# Patient Record
Sex: Female | Born: 1945 | Race: Asian | Hispanic: No | Marital: Married | Smoking: Never smoker
Health system: Southern US, Community
[De-identification: ages and names within clinical notes are randomized; demographics above are authoritative.]

## PROBLEM LIST (undated history)

## (undated) DIAGNOSIS — I10 Essential (primary) hypertension: Secondary | ICD-10-CM

## (undated) DIAGNOSIS — E785 Hyperlipidemia, unspecified: Secondary | ICD-10-CM

## (undated) DIAGNOSIS — M1990 Unspecified osteoarthritis, unspecified site: Secondary | ICD-10-CM

## (undated) HISTORY — DX: Essential (primary) hypertension: I10

## (undated) HISTORY — PX: CATARACT EXTRACTION: SUR2

## (undated) HISTORY — DX: Hyperlipidemia, unspecified: E78.5

## (undated) HISTORY — DX: Unspecified osteoarthritis, unspecified site: M19.90

---

## 2003-08-06 ENCOUNTER — Ambulatory Visit (HOSPITAL_COMMUNITY): Admission: RE | Admit: 2003-08-06 | Discharge: 2003-08-06 | Payer: Self-pay | Admitting: Internal Medicine

## 2003-08-06 ENCOUNTER — Encounter: Payer: Self-pay | Admitting: Internal Medicine

## 2005-06-27 ENCOUNTER — Ambulatory Visit: Payer: Self-pay | Admitting: Internal Medicine

## 2007-10-23 HISTORY — PX: MINOR HEMORRHOIDECTOMY: SHX6238

## 2010-07-31 ENCOUNTER — Encounter: Payer: Self-pay | Admitting: Family Medicine

## 2010-07-31 ENCOUNTER — Ambulatory Visit: Payer: Self-pay | Admitting: Family Medicine

## 2010-07-31 DIAGNOSIS — E785 Hyperlipidemia, unspecified: Secondary | ICD-10-CM | POA: Insufficient documentation

## 2010-07-31 DIAGNOSIS — I10 Essential (primary) hypertension: Secondary | ICD-10-CM | POA: Insufficient documentation

## 2010-07-31 DIAGNOSIS — J309 Allergic rhinitis, unspecified: Secondary | ICD-10-CM | POA: Insufficient documentation

## 2010-07-31 LAB — CONVERTED CEMR LAB
ALT: 13 units/L (ref 0–35)
AST: 14 units/L (ref 0–37)
Albumin: 4.1 g/dL (ref 3.5–5.2)
Alkaline Phosphatase: 103 units/L (ref 39–117)
BUN: 12 mg/dL (ref 6–23)
CO2: 29 meq/L (ref 19–32)
Calcium: 9.3 mg/dL (ref 8.4–10.5)
Chloride: 103 meq/L (ref 96–112)
Creatinine, Ser: 0.6 mg/dL (ref 0.40–1.20)
Glucose, Bld: 123 mg/dL — ABNORMAL HIGH (ref 70–99)
HCT: 37.7 % (ref 36.0–46.0)
Hemoglobin: 12.1 g/dL (ref 12.0–15.0)
MCHC: 32.1 g/dL (ref 30.0–36.0)
MCV: 86.7 fL (ref 78.0–100.0)
Platelets: 335 10*3/uL (ref 150–400)
Potassium: 4 meq/L (ref 3.5–5.3)
RBC: 4.35 M/uL (ref 3.87–5.11)
RDW: 13 % (ref 11.5–15.5)
Sodium: 144 meq/L (ref 135–145)
Total Bilirubin: 0.8 mg/dL (ref 0.3–1.2)
Total Protein: 7.1 g/dL (ref 6.0–8.3)
WBC: 9.5 10*3/uL (ref 4.0–10.5)

## 2010-11-16 ENCOUNTER — Encounter (INDEPENDENT_AMBULATORY_CARE_PROVIDER_SITE_OTHER): Payer: Self-pay | Admitting: *Deleted

## 2010-11-21 NOTE — Assessment & Plan Note (Signed)
Summary: np,tcb   Vital Signs:  Patient profile:   65 year old Hart Weight:      168 pounds Temp:     99.2 degrees F oral Pulse rate:   73 / minute BP sitting:   121 / 76  (left arm)  Vitals Entered By: Jimmy Footman, CMA (July 31, 2010 2:32 PM) CC: NP Is Patient Diabetic? No   Primary Care Provider:  Alvia Grove  CC:  NP.  History of Present Illness: 65 yo Hart here to establish care.  Last seen by [facility] about 1 year ago. Pt does not have any records with her and she is very unsure of her medical history.  Did bring MOST medicatioins with her today, but admits to forgetting 1 or 2 bottles.   1. Hypertension: On atenolol, furosemide, asprin, fish oil, and K-Cl.  Stopped amlodipine a few weeks ago because she felt it made her legs swell.  Denies any chest pain, no SOB, no DOE. No history of MI's, no hospitalizations.  States dx was made at last OV at [facility].  Takes meds daily except for amlodipine.  No vision changes.  2. HLD: Taking statin daily, tolerates well.  No muscle aches or pains.  Has been taking for > 1 year.  Also takes OTC fish oil. 3. Allergies: Endorses runny nose, itchy eyes, itchy, dry ears.  Started about 5-6 years ago and feels symptoms worsen yearly.  Worse in the fall. Never taken any meds for this.   Habits & Providers  Alcohol-Tobacco-Diet     Alcohol drinks/day: 0     Tobacco Status: never  Exercise-Depression-Behavior     Drug Use: no  Current Problems (verified): 1)  Hypertension  (ICD-401.9) 2)  Family History of Anemia  (ICD-V18.2) 3)  Allergic Rhinitis  (ICD-477.9) 4)  Encounter For Long-term Use of Other Medications  (ICD-V58.69) 5)  Hyperlipidemia  (ICD-272.4) 6)  Essential Hypertension, Benign  (ICD-401.1)  Current Medications (verified): 1)  Atenolol 25 Mg Tabs (Atenolol) .... Take 1 Pill By Mouth At Bedtime 2)  Lotensin 20 Mg Tabs (Benazepril Hcl) .... Take 1 Pill By Mouth 3)  Aspirin 81 Mg Tbec (Aspirin) .Marland Kitchen.. 1 Pill By  Mouth Daily 4)  Pravastatin Sodium 40 Mg Tabs (Pravastatin Sodium) .Marland Kitchen.. 1 Pill By Mouth Daily 5)  Furosemide 20 Mg Tabs (Furosemide) .... Take 1 Pill By Mouth Daily 6)  K-Tabs 10 Meq Cr-Tabs (Potassium Chloride) .Marland Kitchen.. 1 Pill By Mouth 3 Times A Week 7)  Claritin 10 Mg Tabs (Loratadine) .... Take 1 Pill By Mouth Two Times A Day 8)  Antipyrine-Benzocaine 5.4-1.4 % Soln (Benzocaine-Antipyrine) .... 2-4 Gtt in Ears Qid As Needed 9)  Fish Oil 1000 Mg Caps (Omega-3 Fatty Acids) .Marland Kitchen.. 1 By Mouth Daily  Allergies (verified): No Known Drug Allergies  Past History:  Family History: Last updated: 07/31/2010 Unknown ? anemia in sibling Father dies at 24 y/o - unknown cause Mother died at 78 y/o - unknown cause  Social History: Last updated: 07/31/2010 Lives in [place] with her husband.  Born in [place], has lived in [place] for 16 years.  Has 3 children and multiple grandchildren. 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 Unknown ? anemia in sibling Father dies at 39 y/o - unknown cause Mother died at 64 y/o -  unknown cause  Social History: Reviewed history and no changes required. Lives in [place] with her husband.  Born in [place], has lived in [place] for 16 years.  Has 3 children and multiple grandchildren. 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 Never Smoked Alcohol use-no Drug use-no Smoking Status:  never Drug Use:  no  Review of Systems  The patient denies anorexia, fever, weight loss, weight gain, vision loss, decreased hearing, hoarseness, chest pain, syncope, dyspnea on exertion, peripheral edema, prolonged cough, headaches, hemoptysis, abdominal pain, melena, hematochezia, severe indigestion/heartburn, hematuria, incontinence,  genital sores, muscle weakness, suspicious skin lesions, transient blindness, difficulty walking, depression, unusual weight change, abnormal bleeding, enlarged lymph nodes, angioedema, breast masses, and testicular masses.    Physical Exam  General:  Vs reviewed, alert, well-developed, well-nourished, and well-hydrated.   Head:  normocephalic and atraumatic.   Eyes:  vision grossly intact.   Ears:  no external deformities.  bilateral ears have dry wax  Nose:  mucosal edema.   Mouth:  postnasal drip.   Neck:  No deformities, masses, or tenderness noted. Lungs:  Normal respiratory effort, chest expands symmetrically. Lungs are clear to auscultation, no crackles or wheezes. Heart:  Normal rate and regular rhythm. S1 and S2 normal without gallop, murmur, click, rub or other extra sounds. Abdomen:  soft, non-tender, and normal bowel sounds.   Msk:  normal ROM and no joint tenderness.   Extremities:  No clubbing, cyanosis, edema, or deformity noted with normal full range of motion of all joints.   Neurologic:  alert & oriented X3, cranial nerves II-XII intact, and DTRs symmetrical and normal.   Skin:  turgor normal.   Psych:  memory intact for recent and remote.     Impression & Recommendations:  Problem # 1:  ESSENTIAL HYPERTENSION, BENIGN (ICD-401.1) Continue current medications today.  Await records from [facility].   Her updated medication list for this problem includes:    Atenolol 25 Mg Tabs (Atenolol) .Marland Kitchen... Take 1 pill by mouth at bedtime    Lotensin 20 Mg Tabs (Benazepril hcl) .Marland Kitchen... Take 1 pill by mouth    Furosemide 20 Mg Tabs (Furosemide) .Marland Kitchen... Take 1 pill by mouth daily  Problem # 2:  HYPERLIPIDEMIA (ICD-272.4) as above check CMP today Orders: [facility] ([id number])  Her updated medication list for this problem includes:    Pravastatin Sodium 40 Mg Tabs (Pravastatin sodium) .Marland Kitchen... 1 pill by mouth daily  Problem # 3:  FAMILY HISTORY OF ANEMIA (ICD-V18.2) Assessment:  New check cbc today. further workup if indicated.  Orders: [facility] ([id number])  Problem # 4:  ALLERGIC RHINITIS (ICD-477.9) Assessment: New daily claritin see pt instructions Her updated medication list for this problem includes:    Claritin 10 Mg Tabs (Loratadine) .Marland Kitchen... Take 1 pill by mouth two times a day  Complete Medication List: 1)  Atenolol 25 Mg Tabs (Atenolol) .... Take 1 pill by mouth at bedtime 2)  Lotensin 20 Mg Tabs (Benazepril hcl) .... Take 1 pill by mouth 3)  Aspirin 81 Mg Tbec (Aspirin) .Marland Kitchen.. 1 pill by mouth daily 4)  Pravastatin Sodium 40 Mg Tabs (Pravastatin sodium) .Marland Kitchen.. 1 pill by mouth daily 5)  Furosemide 20 Mg Tabs (Furosemide) .... Take 1 pill by mouth daily 6)  K-tabs 10 Meq Cr-tabs (Potassium chloride) .Marland Kitchen.. 1 pill by mouth 3 times a week 7)  Claritin 10 Mg Tabs (Loratadine) .... Take 1 pill by mouth two times a day 8)  Antipyrine-benzocaine 5.4-1.4 % Soln (Benzocaine-antipyrine) .... 2-4 gtt  in ears qid as needed 9)  Fish Oil 1000 Mg Caps (Omega-3 fatty acids) .Marland Kitchen.. 1 by mouth daily  Patient Instructions: 1)  Nice to meet you today! 2)  I have refilled your meds and sent them to [facility]. 3)  I added Claritin and an ear drop for you to take 4)  I will check labs today and call you if we need to change any of your medicines. 5)  Please fill out release of information from [facility] so that I can review your previous labs. 6)  Please schedule a follow-up appointment in 1 month.  Prescriptions: ANTIPYRINE-BENZOCAINE 5.4-1.4 % SOLN (BENZOCAINE-ANTIPYRINE) 2-4 gtt in ears QID as needed  #3mL x 1   Entered and Authorized by:   Alvia Grove DO   Signed by:   Alvia Grove DO on 07/31/2010   Method used:   Electronically to        [facility]* (retail)       [address] [place], [place]  [address]       Ph: [phone]       Fax: [phone]   RxID:   [id number] CLARITIN 10 MG TABS (LORATADINE) take 1 pill by mouth  two times a day  #60 x 5   Entered and Authorized by:   Alvia Grove DO   Signed by:   Alvia Grove DO on 07/31/2010   Method used:   Electronically to        [facility]* (retail)       [address] [place], [place]  [address]       Ph: [phone]       Fax: [phone]   RxID:   [id number] PRAVASTATIN SODIUM 40 MG TABS (PRAVASTATIN SODIUM) 1 pill by mouth daily  #30 x 0   Entered and Authorized by:   Alvia Grove DO   Signed by:   Alvia Grove DO on 07/31/2010   Method used:   Electronically to        [facility]* (retail)       [address] [place], [place]  [address]       Ph: [phone]       Fax: [phone]   RxID:   [id number] K-TABS 10 MEQ CR-TABS (POTASSIUM CHLORIDE) 1 pill by mouth 3 times a week  #60 x 1   Entered and Authorized by:   Alvia Grove DO   Signed by:   Alvia Grove DO on 07/31/2010   Method used:   Electronically to        [facility]* (retail)       [address] [place], [place]  [address]       Ph: [phone]       Fax: [phone]   RxID:   [id number] FUROSEMIDE 20 MG TABS (FUROSEMIDE) take 1 pill by mouth daily  #30 x 5   Entered and Authorized by:   Alvia Grove DO   Signed by:   Alvia Grove DO on 07/31/2010   Method used:   Electronically to        [facility]* (retail)  [address] [place], [place]  [address]       Ph: [phone]       Fax: [phone]   RxID:   [id number] LOTENSIN 20 MG TABS (BENAZEPRIL HCL) take 1 pill by mouth  #30 x 5   Entered and Authorized by:   Alvia Grove DO   Signed by:   Alvia Grove DO on 07/31/2010   Method used:   Electronically to        [facility]* (retail)       [address] [place], [place]  [address]       Ph: [phone]       Fax: [phone]   RxID:    [id number]

## 2010-11-21 NOTE — Miscellaneous (Signed)
Summary: ROI  ROI   Imported By: De Nurse 08/09/2010 16:20:36  _____________________________________________________________________  External Attachment:    Type:   Image     Comment:   External Document

## 2010-11-23 NOTE — Letter (Signed)
Summary: Generic Letter  [facility]  [address]   [place], [place] [address]   Phone: [phone]  Fax: [phone]    11/16/2010  [address] [place], [place]  [address]  Dear Ms. [facility],  We are happy to let you know that since you are covered under Medicare you are able to have a FREE visit at the [facility] to discuss your HEALTH. This is a new benefit for Medicare.  There will be no co-payment.  At this visit you will meet with Arlys John an expert in wellness and the health coach at our clinic.  At this visit we will discuss ways to keep you healthy and feeling well.  This visit will not replace your regular doctor visit and we cannot refill medications.     You will need to plan to be here at least one hour to talk about your medical history, your current status, review all of your medications, and discuss your future plans for your health.  This information will be entered into your record for your doctor to have and review.  If you are interested in staying healthy, this type of visit can help.  Please call the office at: [phone], to schedule a "Medicare Wellness Visit".  The day of the visit you should bring in all of your medications, including any vitamins, herbs, over the counter products you take.  Make a list of all the other doctors that you see, so we know who they are. If you have any other health documents please bring them.  We look forward to helping you stay healthy.  Sincerely,   [facility]  [facility]

## 2010-12-01 ENCOUNTER — Encounter: Payer: Self-pay | Admitting: *Deleted

## 2011-02-26 ENCOUNTER — Emergency Department (HOSPITAL_COMMUNITY): Payer: Medicare Other

## 2011-02-26 ENCOUNTER — Emergency Department (HOSPITAL_COMMUNITY)
Admission: EM | Admit: 2011-02-26 | Discharge: 2011-02-26 | Disposition: A | Discharge status: Home / Self Care | Payer: Medicare Other | Attending: Emergency Medicine | Admitting: Emergency Medicine

## 2011-02-26 DIAGNOSIS — S8263XA Displaced fracture of lateral malleolus of unspecified fibula, initial encounter for closed fracture: Secondary | ICD-10-CM | POA: Insufficient documentation

## 2011-02-26 DIAGNOSIS — M25476 Effusion, unspecified foot: Secondary | ICD-10-CM | POA: Insufficient documentation

## 2011-02-26 DIAGNOSIS — S298XXA Other specified injuries of thorax, initial encounter: Secondary | ICD-10-CM | POA: Insufficient documentation

## 2011-02-26 DIAGNOSIS — R071 Chest pain on breathing: Secondary | ICD-10-CM | POA: Insufficient documentation

## 2011-02-26 DIAGNOSIS — I10 Essential (primary) hypertension: Secondary | ICD-10-CM | POA: Insufficient documentation

## 2011-02-26 DIAGNOSIS — Z79899 Other long term (current) drug therapy: Secondary | ICD-10-CM | POA: Insufficient documentation

## 2011-02-26 DIAGNOSIS — M25579 Pain in unspecified ankle and joints of unspecified foot: Secondary | ICD-10-CM | POA: Insufficient documentation

## 2011-02-26 DIAGNOSIS — M25473 Effusion, unspecified ankle: Secondary | ICD-10-CM | POA: Insufficient documentation

## 2011-12-14 ENCOUNTER — Encounter: Payer: Self-pay | Admitting: Family Medicine

## 2011-12-14 ENCOUNTER — Other Ambulatory Visit (HOSPITAL_COMMUNITY)
Admission: RE | Admit: 2011-12-14 | Discharge: 2011-12-14 | Disposition: A | Discharge status: Home / Self Care | Payer: Medicare Other | Source: Ambulatory Visit | Attending: Family Medicine | Admitting: Family Medicine

## 2011-12-14 ENCOUNTER — Ambulatory Visit (INDEPENDENT_AMBULATORY_CARE_PROVIDER_SITE_OTHER): Payer: Medicare Other | Admitting: Family Medicine

## 2011-12-14 DIAGNOSIS — N898 Other specified noninflammatory disorders of vagina: Secondary | ICD-10-CM

## 2011-12-14 DIAGNOSIS — Z23 Encounter for immunization: Secondary | ICD-10-CM

## 2011-12-14 DIAGNOSIS — Z01419 Encounter for gynecological examination (general) (routine) without abnormal findings: Secondary | ICD-10-CM

## 2011-12-14 DIAGNOSIS — N760 Acute vaginitis: Secondary | ICD-10-CM

## 2011-12-14 DIAGNOSIS — I10 Essential (primary) hypertension: Secondary | ICD-10-CM

## 2011-12-14 DIAGNOSIS — E785 Hyperlipidemia, unspecified: Secondary | ICD-10-CM

## 2011-12-14 DIAGNOSIS — Z124 Encounter for screening for malignant neoplasm of cervix: Secondary | ICD-10-CM

## 2011-12-14 DIAGNOSIS — Z0000 Encounter for general adult medical examination without abnormal findings: Secondary | ICD-10-CM

## 2011-12-14 LAB — POCT WET PREP (WET MOUNT)

## 2011-12-14 NOTE — Assessment & Plan Note (Signed)
Elevated today, will start HCTZ, d/c lostartan as she is not taking it, and continue atenolol.  She is to follow up in 1 month for bp check.  Will check BMET today to monitor electrolytes and kidney function.

## 2011-12-14 NOTE — Patient Instructions (Signed)
It was nice to meet you.  Your blood pressure today was BP: 187/84 mmHg.  Remember your goal blood pressure is about 120/80.  Please be sure to take your medication every day.  I want you to continue your Atenolol once daily.  I am also adding a new blood pressure medication called Hydrochlorothiazide, or HCTZ.  You should take this medication every day too.    I wills send you a letter with your lab results, or call you if any medication changes need to be made.    Please make a nurse visit in about one month to have your blood pressure re-checked.

## 2011-12-14 NOTE — Assessment & Plan Note (Signed)
Wet prep negative for yeast and no concerning signs on exam.  Will provide reassurance.

## 2011-12-14 NOTE — Assessment & Plan Note (Signed)
Pap done today, if normal will recommend no further paps.  She received Pneumonia and TDAP today.

## 2011-12-14 NOTE — Progress Notes (Signed)
Addended by: Jose Persia on: 12/14/2011 05:37 PM   Modules accepted: Orders

## 2011-12-14 NOTE — Progress Notes (Signed)
  Subjective:    Patient ID: Jacqueline Hart, female    DOB: 07/21/1946, 66 y.o.   MRN: [id number]  HPI  Ms. Clelia Croft presents for annual physical.    She complains of some vaginal itching, but says it is not every day. She denies being sexually active. She is post menopausal.   Pt does not think she has ever had a pap smear.  She states she did have her flu shot, but does not know when her last tetanus and does not think she has ever had the pneumonia shot.   Pt also complains of itching in her left ear.  This has been going on for years, but is bothering her again the past few weeks.  She denies pain or drainage from the ear.  She has never taken an antihistamine for this.   HTN- pt is taking atenolol once a day.  She says she takes it every day.  She was supposed to be taking losartan, but says that it makes her cough, so she stopped taking it.  She denies headaches, chest pain, or dyspnea.   HLD- pt taking pravastatin without side effects, overdue for cholesterol panel, fasting this am.   Review of Systems Pertinent items in HPI.     Objective:   Physical Exam BP 187/84  Pulse 60  Temp(Src) 97.6 F (36.4 C) (Oral)  Ht 5' 0.5" (1.537 m)  Wt 166 lb 14.4 oz (75.705 kg)  BMI 32.06 kg/m2 General appearance: alert, cooperative and no distress Eyes: conjunctivae/corneas clear. PERRL, EOM's intact. Fundi benign. Ears: normal TM's and external ear canals both ears Nose: Nares normal. Septum midline. Mucosa normal. No drainage or sinus tenderness. Throat: lips, mucosa, and tongue normal; teeth and gums normal Neck: no adenopathy, no carotid bruit, no JVD, supple, symmetrical, trachea midline and thyroid not enlarged, symmetric, no tenderness/mass/nodules Lungs: clear to auscultation bilaterally Breasts: normal appearance, no masses or tenderness Heart: regular rate and rhythm, S1, S2 normal, no murmur, click, rub or gallop Pelvic: cervix normal in appearance, external genitalia normal, no  adnexal masses or tenderness, no cervical motion tenderness, rectovaginal septum normal, uterus normal size, shape, and consistency and vagina normal without discharge       Assessment & Plan:

## 2011-12-14 NOTE — Assessment & Plan Note (Signed)
No lipid panel on file, will check today.  Continue pravastatin.

## 2011-12-15 LAB — BASIC METABOLIC PANEL
CO2: 29 mEq/L (ref 19–32)
Chloride: 101 mEq/L (ref 96–112)
Glucose, Bld: 127 mg/dL — ABNORMAL HIGH (ref 70–99)
Potassium: 4.2 mEq/L (ref 3.5–5.3)
Sodium: 139 mEq/L (ref 135–145)

## 2011-12-15 LAB — LIPID PANEL
Total CHOL/HDL Ratio: 4.3 Ratio
VLDL: 57 mg/dL — ABNORMAL HIGH (ref 0–40)

## 2011-12-17 ENCOUNTER — Telehealth: Payer: Self-pay | Admitting: Family Medicine

## 2011-12-17 MED ORDER — ASPIRIN 81 MG PO TBEC: 81.0000 mg | DELAYED_RELEASE_TABLET | Freq: Every day | ORAL | Status: DC

## 2011-12-17 MED ORDER — PRAVASTATIN SODIUM 40 MG PO TABS: 40.0000 mg | ORAL_TABLET | Freq: Every day | ORAL | Status: DC

## 2011-12-17 MED ORDER — HYDROCHLOROTHIAZIDE 25 MG PO TABS: 25.0000 mg | ORAL_TABLET | Freq: Every day | ORAL | Status: DC

## 2011-12-17 MED ORDER — ATENOLOL 25 MG PO TABS: 25.0000 mg | ORAL_TABLET | Freq: Every day | ORAL | Status: DC

## 2011-12-17 NOTE — Telephone Encounter (Signed)
Refills sent

## 2011-12-17 NOTE — Telephone Encounter (Signed)
Patient is calling because the meds that were sent to [facility] on [address] earlier today, [name] says they did not get.  Patient would like the nurse to call them back.

## 2011-12-17 NOTE — Telephone Encounter (Signed)
RX have been sent . Called pharmacy to verify they received. Patient notified.

## 2011-12-17 NOTE — Telephone Encounter (Addendum)
Will forward message to Dr. Lula Olszewski. Dr. Lula Olszewski paged and she will address.

## 2011-12-17 NOTE — Telephone Encounter (Signed)
Pt is calling about her medicines - the doctor was supposed to send in her medicines and they haven't rec'd it yet.  Needs today, please [facility]

## 2012-01-01 ENCOUNTER — Ambulatory Visit (INDEPENDENT_AMBULATORY_CARE_PROVIDER_SITE_OTHER): Payer: Medicare Other | Admitting: Family Medicine

## 2012-01-01 ENCOUNTER — Other Ambulatory Visit: Payer: Self-pay | Admitting: Family Medicine

## 2012-01-01 ENCOUNTER — Encounter: Payer: Self-pay | Admitting: Family Medicine

## 2012-01-01 VITALS — BP 151/81 | HR 65 | Temp 97.5°F | Ht 60.5 in | Wt 165.5 lb

## 2012-01-01 DIAGNOSIS — K5900 Constipation, unspecified: Secondary | ICD-10-CM

## 2012-01-01 DIAGNOSIS — E781 Pure hyperglyceridemia: Secondary | ICD-10-CM

## 2012-01-01 DIAGNOSIS — K5909 Other constipation: Secondary | ICD-10-CM | POA: Insufficient documentation

## 2012-01-01 DIAGNOSIS — R7309 Other abnormal glucose: Secondary | ICD-10-CM

## 2012-01-01 DIAGNOSIS — E119 Type 2 diabetes mellitus without complications: Secondary | ICD-10-CM

## 2012-01-01 DIAGNOSIS — I10 Essential (primary) hypertension: Secondary | ICD-10-CM

## 2012-01-01 DIAGNOSIS — E785 Hyperlipidemia, unspecified: Secondary | ICD-10-CM

## 2012-01-01 MED ORDER — ATENOLOL 25 MG PO TABS: 50.0000 mg | ORAL_TABLET | Freq: Every day | ORAL | Status: DC

## 2012-01-01 MED ORDER — PRAVASTATIN SODIUM 40 MG PO TABS: 40.0000 mg | ORAL_TABLET | Freq: Every day | ORAL | Status: DC

## 2012-01-01 MED ORDER — BENAZEPRIL HCL 10 MG PO TABS: 10.0000 mg | ORAL_TABLET | Freq: Every day | ORAL | Status: DC

## 2012-01-01 MED ORDER — METFORMIN HCL ER 500 MG PO TB24: 500.0000 mg | ORAL_TABLET | Freq: Every day | ORAL | Status: DC

## 2012-01-01 MED ORDER — ATENOLOL 50 MG PO TABS: 50.0000 mg | ORAL_TABLET | Freq: Every day | ORAL | Status: DC

## 2012-01-01 MED ORDER — HYDROCHLOROTHIAZIDE 25 MG PO TABS: 25.0000 mg | ORAL_TABLET | Freq: Every day | ORAL | Status: DC

## 2012-01-01 NOTE — Progress Notes (Signed)
  Subjective:    Patient ID: Jacqueline Hart, female    DOB: 11/14/1945, 66 y.o.   MRN: [id number]  HPI  Ms. Jacqueline Hart comes in for follow up.  Her husband is here today to help with translation as there is a language barrier.  They state they prefer this to an interpreter over the phone.   BP- she is taking her atenolol and HCTZ without side effects. She denies chest pain, dyspnea, leg swelling.   Cholesterol- taking pravastatin.  Reviewed cholesterol, LDL and Total cholesterol OK on this medication.  Pt says she was told once to take fish oil, and she says she takes it once or twice a week.   Epic's medication reconciliation notified me that patient has also filled Metformin under her insurance.  When asked about this, she tells me she has been diagnosed with Diabetes, although she did not mention this her last visit.  She takes Metformin XL 500 mg once a day.  She does not check her blood sugar at home.   Ms. Jacqueline Hart complains that she has a lot of pain when having bowel movements.  She says that she as small, hard stools a few times a day.  She has taken a stool softener from time to time, which does help.  She eats a vegetarian diet and says she drinks plenty of water. No blood in her stool or dark black stools.    Review of Systems Pertinent items in HPI.     Objective:   Physical Exam BP 151/81  Pulse 65  Temp(Src) 97.5 F (36.4 C) (Oral)  Ht 5' 0.5" (1.537 m)  Wt 165 lb 8 oz (75.07 kg)  BMI 31.79 kg/m2 General appearance: alert, cooperative and no distress Neck: no adenopathy, supple, symmetrical, trachea midline and thyroid not enlarged, symmetric, no tenderness/mass/nodules Lungs: clear to auscultation bilaterally Heart: regular rate and rhythm, S1, S2 normal, no murmur, click, rub or gallop Abdomen: soft, non-tender; bowel sounds normal; no masses,  no organomegaly Extremities: extremities normal, atraumatic, no cyanosis or edema Pulses: 2+ and symmetric Skin: Skin color, texture,  turgor normal. No rashes or lesions Neurologic: Grossly normal       Assessment & Plan:

## 2012-01-01 NOTE — Assessment & Plan Note (Signed)
A1C at goal.  Will continue metformin.

## 2012-01-01 NOTE — Assessment & Plan Note (Signed)
Total cholesterol and LDL at goal, continue pravastatin at current dosage.

## 2012-01-01 NOTE — Assessment & Plan Note (Signed)
Advised increasing exercise and taking 2000 mg of fish oil daily.

## 2012-01-01 NOTE — Assessment & Plan Note (Signed)
Advised increasing water intake and trying a daily fiber supplement.  If this does not work, I told her it was OK to do a stool softener (not a laxative) daily.

## 2012-01-01 NOTE — Assessment & Plan Note (Signed)
Improved but not yet at goal.  Will add ace inhibitor to HCTZ and atenolol.

## 2012-01-01 NOTE — Patient Instructions (Signed)
Your blood pressure today was BP: 151/81 mmHg.  Remember your goal blood pressure is about 120/80.  Please be sure to take your medication every day.  I have added a third blood pressure medication called benazepril, this is a once daily medication.    For your cholesterol, please continue your pravastatin at the current dose.  You should also take two fish oil tablets once a day for your high triglycerides.   For your diabetes, your blood sugar is well controlled, continue the metformin at the current dose.   For your constipation, you can try taking a fiber supplement every day.  If this does not help, you should start taking a stool softener.    Please come back and see me when you return from your trip.

## 2012-01-14 ENCOUNTER — Telehealth: Payer: Self-pay | Admitting: *Deleted

## 2012-01-14 NOTE — Telephone Encounter (Signed)
Received message on office voicemail.  Received diabetes form for testing supplies.  Medicare guidelines have changed and they are not accepting high-frequency testing if patient is not insulin-dependent.  Medicare only approves for once-a-day testing for non-insulin dependent.  Please change to once a day to be covered by Medicare and fax back to [phone].  Gaylene Brooks, RN

## 2012-01-16 NOTE — Telephone Encounter (Signed)
I have filled out paperwork again, with once a day fasting blood sugar monitoring ordered.  Form placed in fax pile.

## 2012-04-08 ENCOUNTER — Encounter: Payer: Self-pay | Admitting: Family Medicine

## 2012-04-08 ENCOUNTER — Ambulatory Visit (INDEPENDENT_AMBULATORY_CARE_PROVIDER_SITE_OTHER): Payer: Medicare Other | Admitting: Family Medicine

## 2012-04-08 VITALS — BP 152/81 | HR 65 | Temp 98.4°F | Ht 60.5 in | Wt 163.0 lb

## 2012-04-08 DIAGNOSIS — M79609 Pain in unspecified limb: Secondary | ICD-10-CM

## 2012-04-08 DIAGNOSIS — K5909 Other constipation: Secondary | ICD-10-CM

## 2012-04-08 DIAGNOSIS — E785 Hyperlipidemia, unspecified: Secondary | ICD-10-CM

## 2012-04-08 DIAGNOSIS — K5900 Constipation, unspecified: Secondary | ICD-10-CM

## 2012-04-08 DIAGNOSIS — M79646 Pain in unspecified finger(s): Secondary | ICD-10-CM

## 2012-04-08 DIAGNOSIS — E119 Type 2 diabetes mellitus without complications: Secondary | ICD-10-CM

## 2012-04-08 DIAGNOSIS — I10 Essential (primary) hypertension: Secondary | ICD-10-CM

## 2012-04-08 DIAGNOSIS — E781 Pure hyperglyceridemia: Secondary | ICD-10-CM

## 2012-04-08 MED ORDER — PRAVASTATIN SODIUM 40 MG PO TABS: 40.0000 mg | ORAL_TABLET | Freq: Every day | ORAL | Status: DC

## 2012-04-08 MED ORDER — METFORMIN HCL ER 500 MG PO TB24: 500.0000 mg | ORAL_TABLET | Freq: Every day | ORAL | Status: DC

## 2012-04-08 MED ORDER — BENAZEPRIL HCL 10 MG PO TABS: 10.0000 mg | ORAL_TABLET | Freq: Every day | ORAL | Status: DC

## 2012-04-08 MED ORDER — ASPIRIN 81 MG PO TBEC: 81.0000 mg | DELAYED_RELEASE_TABLET | Freq: Every day | ORAL | Status: DC

## 2012-04-08 MED ORDER — HYDROCHLOROTHIAZIDE 25 MG PO TABS: 25.0000 mg | ORAL_TABLET | Freq: Every day | ORAL | Status: DC

## 2012-04-08 MED ORDER — ATENOLOL 50 MG PO TABS: 50.0000 mg | ORAL_TABLET | Freq: Every day | ORAL | Status: DC

## 2012-04-08 NOTE — Assessment & Plan Note (Addendum)
Suspect this is still elevated, but Medicare will not pay for more than once yearly lipid panel.  Encouraged patient to take fish oil - will try to help her manage constipation.

## 2012-04-08 NOTE — Assessment & Plan Note (Addendum)
LDL and total cholesterol have been relatively stable on Pravachol, will continue at current dose.

## 2012-04-08 NOTE — Assessment & Plan Note (Signed)
Poorly controlled today, patient off benazepril.  Will refill all medications - reviewed importance of compliance to reduce risk of MI or CVA.  Patient agrees to monitor it at home and call the office for a visit if it continues to be higher than 140/85.

## 2012-04-08 NOTE — Patient Instructions (Signed)
It was good to see you today.  Your Hemoglobin A1C is  Lab Results  Component Value Date   HGBA1C 6.1 04/08/2012  .  Remember your goal for A1C is less than 7.  Your goal for fasting morning blood sugar is 80-120.   Your blood pressure today was BP: 152/81 mmHg.  Remember your goal blood pressure is about 130/80.  Please be sure to take your medication every day.  Please take your blood pressure when you are at the pharmacy.  If it remains above 140/85, please call the office for an appointment.   I think you have arthritis in your hands.  You can try Acetaminophen (Tylenol), to extra strength tablets (two 500 mg tablets) up to three times a day for the pain.  I also suggest buying Aspercreme, a topical anti-inflammatory cream to put on your finger when it hurts.   For your constipation, please try increasing MiraLAX, you can increase to 3 capfuls a day, or even up to 4 capfulls a day.  Remember you need to drink a lot of water (NOT JUICE) for your constipation.   I will send you a letter with your lab results.   Please be sure to take fish oil for your cholesterol.

## 2012-04-08 NOTE — Progress Notes (Signed)
  Subjective:    Patient ID: Jacqueline Hart, female    DOB: December 10, 1945, 66 y.o.   MRN: [id number]  HPI  Jacqueline Hart presents for follow up.    HTN- was taking atenolol, benazepril, HCTZ, but ran out of benazepril 4 days ago.  She says she does not understand why it is so high though, and says when she was in [place] for the past few months it was lower, but does say it was rarely under 140/85.  She denies chest pain, dyspnea, palpitations or LE edema.   HLD/hypertriglyceridemia- taking pravastatin, says she is trying to watch the cholesterol in her diet.  She says she tried fish oil for a few weeks but thinks it made her more constipated.   Constipation- patient says she does not like to drink water, and drinks orange juice several days a week.  She does not drink much water.  She says she takes some OTC tablets at bedtime, and occasional Miralax, 1/2 a capful a few times a day when she remembers.  She says she has 1-2 bowel movements daily, but says she has to strain and the bowel movements are hard.   Finger pain- patient describes pain in left 3rd finger.  She has difficulty saying how long it has been there, and says it hurts all over the finger, when she is using it, but also at night time.  She has tried some icy hot on it, which helps some.   Past Medical History  Diagnosis Date  . Diabetes mellitus   . Hyperlipidemia   . Hypertension    History  Substance Use Topics  . Smoking status: Never Smoker   . Smokeless tobacco: Not on file  . Alcohol Use: Not on file    Review of Systems Pertinent items in HPI.     Objective:   Physical Exam BP 152/81  Pulse 65  Temp 98.4 F (36.9 C) (Oral)  Ht 5' 0.5" (1.537 m)  Wt 163 lb (73.936 kg)  BMI 31.31 kg/m2 General appearance: alert, cooperative and no distress Neck: no adenopathy, supple, symmetrical, trachea midline and thyroid not enlarged, symmetric, no tenderness/mass/nodules Lungs: clear to auscultation bilaterally Heart:  regular rate and rhythm, S1, S2 normal, no murmur, click, rub or gallop Abdomen: soft, non-tender; bowel sounds normal; no masses,  no organomegaly Pulses: 2+ and symmetric MSK: Hands: patient has prominent knuckles and joints.  Left middle finger with pain with squeezing joints.  Good ROM, strength and sensation in tact.        Assessment & Plan:

## 2012-04-08 NOTE — Assessment & Plan Note (Addendum)
Discussed importance of drinking water and eating fiber.  Discussed taking MiraLAX every day and increasing it so she has one soft bowel movement daily.

## 2012-04-08 NOTE — Assessment & Plan Note (Signed)
Well controlled on metformin

## 2012-04-08 NOTE — Assessment & Plan Note (Signed)
Suspect arthritis based on history and exam.  Suggested scheduling Tylenol and using Aspercreme.

## 2012-04-09 ENCOUNTER — Telehealth: Payer: Self-pay | Admitting: Family Medicine

## 2012-04-09 NOTE — Telephone Encounter (Signed)
Paged Dr. Lula Olszewski and she states  they did not discuss cough yesterday and patient will need to be seen before chest xray can be ordered.   Spoke with daughter and she states cough actually started yesterday afternoon.  Has been taking Mucinex and expectorating much mucous.  She is coughing a great deal and kept her up last night.  She does get slight  shortness of breath  with exertion. Appointment scheduled for tomorrow AM. Daughter will take her to Urgent Care or ED if she feels her cough is worsening or other cause for concern. She does not feel it is an emergency.

## 2012-04-09 NOTE — Telephone Encounter (Signed)
Daughter is calling because her mother was seen yesterday for a bad cough, was not prescribed anything for the cough, was told to take over the counter meds, but nothing has helped and she did not sleep last night.  The daughter is a Engineer, civil (consulting) and says that she has listened to her chest and is concerned that she needs an xray and is hoping for an order for an xray so she can go ahead and take her.

## 2012-04-10 ENCOUNTER — Encounter: Payer: Self-pay | Admitting: Family Medicine

## 2012-04-10 ENCOUNTER — Ambulatory Visit (INDEPENDENT_AMBULATORY_CARE_PROVIDER_SITE_OTHER): Payer: Medicare Other | Admitting: Family Medicine

## 2012-04-10 VITALS — BP 112/62 | HR 76 | Temp 99.1°F | Ht 60.0 in | Wt 161.0 lb

## 2012-04-10 DIAGNOSIS — J069 Acute upper respiratory infection, unspecified: Secondary | ICD-10-CM | POA: Insufficient documentation

## 2012-04-10 MED ORDER — AZITHROMYCIN 500 MG PO TABS: 500.0000 mg | ORAL_TABLET | Freq: Every day | ORAL | Status: AC

## 2012-04-10 MED ORDER — DM-PHENYLEPHRINE-ACETAMINOPHEN 10-5-325 MG PO CAPS: 1.0000 | ORAL_CAPSULE | Freq: Two times a day (BID) | ORAL | Status: DC

## 2012-04-10 MED ORDER — DOXYCYCLINE HYCLATE 100 MG PO TABS: 100.0000 mg | ORAL_TABLET | Freq: Two times a day (BID) | ORAL | Status: AC

## 2012-04-10 NOTE — Progress Notes (Signed)
  Subjective:     Jacqueline Hart is a 66 y.o. female who presents for evaluation of symptoms of a URI. Symptoms include congestion and cough described as productive of white sputum. Onset of symptoms was 4 days ago, and has been gradually improving since that time. Treatment to date: antibiotics took one 500 mg azithromycin tab. She has a URI like this annually with weather changes. No seasonal allergies. No sick contacts. Never had pneumonia before, never admitted to the hospital for similar. No ab allergies.   Review of Systems Pertinent items are noted in HPI.   Objective:   Filed Vitals:   04/10/12 0904  BP: 112/62  Pulse: 76  Temp: 99.1 F (37.3 C)  TempSrc: Oral  Height: 5' (1.524 m)  Weight: 161 lb (73.029 kg)  Lungs:  Normal respiratory effort, chest expands symmetrically. Lungs are clear to auscultation, no crackles or wheezes. Heart - Regular rate and rhythm.  No murmurs, gallops or rubs.    Mouth - no lesions, mucous membranes are moist, no decaying teeth  Nose:  External nasal examination shows no deformity or inflammation. Nasal mucosa are pink and moist without lesions or exudates. No septal dislocation or dislocation.No obstruction to airflow. Neck:  No deformities, thyromegaly, masses, or tenderness noted.   Supple with full range of motion without pain.  Assessment:

## 2012-04-10 NOTE — Assessment & Plan Note (Signed)
Gave prescription for azithromycin and doxy, they will purchase the cheapest.  No need for xray of the chest per my clinical exam.  Reviewed phone note from daughter.  She will follow up prn.

## 2012-04-10 NOTE — Patient Instructions (Addendum)
I think you have an acute upper respiratory infection.  I sent in two antibiotics, buy the one that is cheaper.  Come back and see [place] in one week.  I will get a chest xray if your symptoms are not better after the antibiotics.

## 2012-04-21 ENCOUNTER — Ambulatory Visit (INDEPENDENT_AMBULATORY_CARE_PROVIDER_SITE_OTHER): Payer: Medicare Other | Admitting: Family Medicine

## 2012-04-21 ENCOUNTER — Encounter: Payer: Self-pay | Admitting: Family Medicine

## 2012-04-21 VITALS — BP 145/81 | HR 65 | Temp 98.1°F | Ht 60.0 in | Wt 159.0 lb

## 2012-04-21 DIAGNOSIS — I10 Essential (primary) hypertension: Secondary | ICD-10-CM

## 2012-04-21 DIAGNOSIS — R059 Cough, unspecified: Secondary | ICD-10-CM

## 2012-04-21 DIAGNOSIS — K5909 Other constipation: Secondary | ICD-10-CM

## 2012-04-21 DIAGNOSIS — R05 Cough: Secondary | ICD-10-CM

## 2012-04-21 DIAGNOSIS — K5900 Constipation, unspecified: Secondary | ICD-10-CM

## 2012-04-21 MED ORDER — POLYETHYLENE GLYCOL 3350 17 GM/SCOOP PO POWD: 17.0000 g | Freq: Three times a day (TID) | ORAL | Status: AC

## 2012-04-21 MED ORDER — AMLODIPINE BESYLATE 10 MG PO TABS: 10.0000 mg | ORAL_TABLET | Freq: Every day | ORAL | Status: DC

## 2012-04-21 MED ORDER — GUAIFENESIN ER 600 MG PO TB12: 1200.0000 mg | ORAL_TABLET | Freq: Two times a day (BID) | ORAL | Status: AC

## 2012-04-21 NOTE — Assessment & Plan Note (Signed)
Elevated today- but will d/c enalapril, and replace with amlodipine.

## 2012-04-21 NOTE — Assessment & Plan Note (Signed)
Again stressed importance of drinking lots of water and fluids.  Rx for Miralax, strongly encouraged her to take this recommendation.

## 2012-04-21 NOTE — Patient Instructions (Signed)
It was good to see you.  I want you to stop taking your Lotensin, I am concerned this may be causing your cough.  I have started a different blood pressure medication in it's place called Amlodipine.  It works differently and should not cause a cough.   You can keep taking the tablets for your constipation, but you need to drink more water.  I want you to take Miralax, a capful mixed in water three times daily.

## 2012-04-21 NOTE — Progress Notes (Signed)
  Subjective:    Patient ID: Jacqueline Hart, female    DOB: Sep 22, 1946, 66 y.o.   MRN: [id number]  HPI  Patient comes in for follow up of her cough.  Her husband accompanies her  She says it has been going on for a month.  She says the cough medication Dr. Rivka Safer gave her helped but did not make the cough go away.  She is also having some nasal congestion.  She says she coughs all night long and can't get any sleep.    She says she started Benazepril about 3 months ago.  She had never taken the medication before.  She denies chest pain, shortness of breath, fever/chills/fatigue.    She also complains again of constipation.  She is taking some over the counter docusate tablets.  She has not started taking Miralax as I suggested at her last visit with me.  She does not drink much water.   Review of Systems Pertinent items in HPI.     Objective:   Physical Exam BP 145/81  Pulse 65  Temp 98.1 F (36.7 C) (Oral)  Ht 5' (1.524 m)  Wt 159 lb (72.122 kg)  BMI 31.05 kg/m2 General appearance: alert, cooperative and no distress Neck: no adenopathy, no carotid bruit, no JVD, supple, symmetrical, trachea midline and thyroid not enlarged, symmetric, no tenderness/mass/nodules Lungs: clear to auscultation bilaterally Heart: regular rate and rhythm, S1, S2 normal, no murmur, click, rub or gallop Abd: +BS, soft, mild distention, no TTP.  Extremities: extremities normal, atraumatic, no cyanosis or edema       Assessment & Plan:

## 2012-04-21 NOTE — Assessment & Plan Note (Signed)
Concern for ACE inhibitor allergy.  Will d/c benazepril.  Rx for mucinex.  She has no red flags for PNA, do not feel a CXR is indicated.

## 2012-05-14 ENCOUNTER — Other Ambulatory Visit: Payer: Self-pay | Admitting: Internal Medicine

## 2012-05-14 DIAGNOSIS — Z1231 Encounter for screening mammogram for malignant neoplasm of breast: Secondary | ICD-10-CM

## 2012-05-27 ENCOUNTER — Ambulatory Visit
Admission: RE | Admit: 2012-05-27 | Discharge: 2012-05-27 | Disposition: A | Discharge status: Home / Self Care | Payer: Medicare Other | Source: Ambulatory Visit | Attending: Internal Medicine | Admitting: Internal Medicine

## 2012-05-27 DIAGNOSIS — Z1231 Encounter for screening mammogram for malignant neoplasm of breast: Secondary | ICD-10-CM

## 2012-06-19 ENCOUNTER — Other Ambulatory Visit: Payer: Self-pay | Admitting: Gastroenterology

## 2016-02-01 DIAGNOSIS — E1165 Type 2 diabetes mellitus with hyperglycemia: Secondary | ICD-10-CM | POA: Diagnosis not present

## 2016-02-01 DIAGNOSIS — Z1231 Encounter for screening mammogram for malignant neoplasm of breast: Secondary | ICD-10-CM | POA: Diagnosis not present

## 2016-02-01 DIAGNOSIS — E038 Other specified hypothyroidism: Secondary | ICD-10-CM | POA: Diagnosis not present

## 2016-02-01 DIAGNOSIS — K219 Gastro-esophageal reflux disease without esophagitis: Secondary | ICD-10-CM | POA: Diagnosis not present

## 2016-02-01 DIAGNOSIS — E782 Mixed hyperlipidemia: Secondary | ICD-10-CM | POA: Diagnosis not present

## 2016-02-01 DIAGNOSIS — Z0000 Encounter for general adult medical examination without abnormal findings: Secondary | ICD-10-CM | POA: Diagnosis not present

## 2016-02-01 DIAGNOSIS — D518 Other vitamin B12 deficiency anemias: Secondary | ICD-10-CM | POA: Diagnosis not present

## 2016-02-01 DIAGNOSIS — R0989 Other specified symptoms and signs involving the circulatory and respiratory systems: Secondary | ICD-10-CM | POA: Diagnosis not present

## 2016-02-01 DIAGNOSIS — Z79899 Other long term (current) drug therapy: Secondary | ICD-10-CM | POA: Diagnosis not present

## 2016-02-01 DIAGNOSIS — E559 Vitamin D deficiency, unspecified: Secondary | ICD-10-CM | POA: Diagnosis not present

## 2016-02-01 DIAGNOSIS — I10 Essential (primary) hypertension: Secondary | ICD-10-CM | POA: Diagnosis not present

## 2016-02-01 DIAGNOSIS — Z1211 Encounter for screening for malignant neoplasm of colon: Secondary | ICD-10-CM | POA: Diagnosis not present

## 2016-02-01 DIAGNOSIS — I714 Abdominal aortic aneurysm, without rupture: Secondary | ICD-10-CM | POA: Diagnosis not present

## 2016-02-01 LAB — BASIC METABOLIC PANEL
CREATININE: 0.7 mg/dL (ref 0.5–1.1)
POTASSIUM: 4.2 mmol/L (ref 3.4–5.3)
Sodium: 144 mmol/L (ref 137–147)

## 2016-02-01 LAB — CBC AND DIFFERENTIAL
HCT: 33 % — AB (ref 36–46)
PLATELETS: 388 10*3/uL (ref 150–399)

## 2016-02-01 LAB — HEPATIC FUNCTION PANEL: Alkaline Phosphatase: 118 U/L (ref 25–125)

## 2016-02-01 LAB — TSH: TSH: 4.89 u[IU]/mL (ref 0.41–5.90)

## 2016-02-01 LAB — LIPID PANEL
Cholesterol: 185 mg/dL (ref 0–200)
LDL Cholesterol: 86 mg/dL

## 2016-02-09 ENCOUNTER — Other Ambulatory Visit: Payer: Self-pay | Admitting: Internal Medicine

## 2016-02-09 DIAGNOSIS — Z1231 Encounter for screening mammogram for malignant neoplasm of breast: Secondary | ICD-10-CM

## 2016-02-23 ENCOUNTER — Ambulatory Visit: Payer: Medicare Other

## 2016-03-09 ENCOUNTER — Ambulatory Visit
Admission: RE | Admit: 2016-03-09 | Discharge: 2016-03-09 | Disposition: A | Discharge status: Home / Self Care | Payer: PPO | Source: Ambulatory Visit | Attending: Internal Medicine | Admitting: Internal Medicine

## 2016-03-09 DIAGNOSIS — Z1231 Encounter for screening mammogram for malignant neoplasm of breast: Secondary | ICD-10-CM | POA: Diagnosis not present

## 2016-06-28 DIAGNOSIS — E782 Mixed hyperlipidemia: Secondary | ICD-10-CM | POA: Diagnosis not present

## 2016-06-28 DIAGNOSIS — I10 Essential (primary) hypertension: Secondary | ICD-10-CM | POA: Diagnosis not present

## 2016-06-28 DIAGNOSIS — E038 Other specified hypothyroidism: Secondary | ICD-10-CM | POA: Diagnosis not present

## 2016-06-28 DIAGNOSIS — Z79899 Other long term (current) drug therapy: Secondary | ICD-10-CM | POA: Diagnosis not present

## 2016-06-28 DIAGNOSIS — E559 Vitamin D deficiency, unspecified: Secondary | ICD-10-CM | POA: Diagnosis not present

## 2016-06-28 DIAGNOSIS — K219 Gastro-esophageal reflux disease without esophagitis: Secondary | ICD-10-CM | POA: Diagnosis not present

## 2016-06-28 DIAGNOSIS — E1165 Type 2 diabetes mellitus with hyperglycemia: Secondary | ICD-10-CM | POA: Diagnosis not present

## 2016-06-28 DIAGNOSIS — D518 Other vitamin B12 deficiency anemias: Secondary | ICD-10-CM | POA: Diagnosis not present

## 2016-10-01 DIAGNOSIS — R609 Edema, unspecified: Secondary | ICD-10-CM | POA: Diagnosis not present

## 2016-10-01 DIAGNOSIS — Z23 Encounter for immunization: Secondary | ICD-10-CM | POA: Diagnosis not present

## 2016-10-01 DIAGNOSIS — Z79899 Other long term (current) drug therapy: Secondary | ICD-10-CM | POA: Diagnosis not present

## 2016-10-01 DIAGNOSIS — I10 Essential (primary) hypertension: Secondary | ICD-10-CM | POA: Diagnosis not present

## 2016-10-01 DIAGNOSIS — D518 Other vitamin B12 deficiency anemias: Secondary | ICD-10-CM | POA: Diagnosis not present

## 2016-10-01 DIAGNOSIS — E559 Vitamin D deficiency, unspecified: Secondary | ICD-10-CM | POA: Diagnosis not present

## 2016-10-01 DIAGNOSIS — R0989 Other specified symptoms and signs involving the circulatory and respiratory systems: Secondary | ICD-10-CM | POA: Diagnosis not present

## 2016-10-01 DIAGNOSIS — E038 Other specified hypothyroidism: Secondary | ICD-10-CM | POA: Diagnosis not present

## 2016-10-01 DIAGNOSIS — K219 Gastro-esophageal reflux disease without esophagitis: Secondary | ICD-10-CM | POA: Diagnosis not present

## 2016-10-01 DIAGNOSIS — E782 Mixed hyperlipidemia: Secondary | ICD-10-CM | POA: Diagnosis not present

## 2016-10-01 DIAGNOSIS — E1165 Type 2 diabetes mellitus with hyperglycemia: Secondary | ICD-10-CM | POA: Diagnosis not present

## 2016-10-10 DIAGNOSIS — E1165 Type 2 diabetes mellitus with hyperglycemia: Secondary | ICD-10-CM | POA: Diagnosis not present

## 2016-10-10 DIAGNOSIS — J208 Acute bronchitis due to other specified organisms: Secondary | ICD-10-CM | POA: Diagnosis not present

## 2016-12-14 DIAGNOSIS — J4520 Mild intermittent asthma, uncomplicated: Secondary | ICD-10-CM | POA: Diagnosis not present

## 2016-12-14 DIAGNOSIS — E782 Mixed hyperlipidemia: Secondary | ICD-10-CM | POA: Diagnosis not present

## 2016-12-14 DIAGNOSIS — I10 Essential (primary) hypertension: Secondary | ICD-10-CM | POA: Diagnosis not present

## 2016-12-14 DIAGNOSIS — E1165 Type 2 diabetes mellitus with hyperglycemia: Secondary | ICD-10-CM | POA: Diagnosis not present

## 2016-12-31 DIAGNOSIS — E782 Mixed hyperlipidemia: Secondary | ICD-10-CM | POA: Diagnosis not present

## 2016-12-31 DIAGNOSIS — E1165 Type 2 diabetes mellitus with hyperglycemia: Secondary | ICD-10-CM | POA: Diagnosis not present

## 2016-12-31 DIAGNOSIS — I10 Essential (primary) hypertension: Secondary | ICD-10-CM | POA: Diagnosis not present

## 2016-12-31 DIAGNOSIS — J4520 Mild intermittent asthma, uncomplicated: Secondary | ICD-10-CM | POA: Diagnosis not present

## 2017-01-23 ENCOUNTER — Telehealth: Payer: Self-pay | Admitting: Family Medicine

## 2017-01-23 ENCOUNTER — Ambulatory Visit (INDEPENDENT_AMBULATORY_CARE_PROVIDER_SITE_OTHER): Payer: PPO | Admitting: Family Medicine

## 2017-01-23 VITALS — BP 132/76 | HR 65 | Temp 97.5°F | Ht 60.5 in | Wt 166.4 lb

## 2017-01-23 DIAGNOSIS — I10 Essential (primary) hypertension: Secondary | ICD-10-CM

## 2017-01-23 DIAGNOSIS — E785 Hyperlipidemia, unspecified: Secondary | ICD-10-CM | POA: Diagnosis not present

## 2017-01-23 DIAGNOSIS — E039 Hypothyroidism, unspecified: Secondary | ICD-10-CM

## 2017-01-23 DIAGNOSIS — E1165 Type 2 diabetes mellitus with hyperglycemia: Secondary | ICD-10-CM | POA: Diagnosis not present

## 2017-01-23 DIAGNOSIS — Z1321 Encounter for screening for nutritional disorder: Secondary | ICD-10-CM | POA: Diagnosis not present

## 2017-01-23 DIAGNOSIS — J4520 Mild intermittent asthma, uncomplicated: Secondary | ICD-10-CM | POA: Diagnosis not present

## 2017-01-23 DIAGNOSIS — E119 Type 2 diabetes mellitus without complications: Secondary | ICD-10-CM | POA: Diagnosis not present

## 2017-01-23 DIAGNOSIS — E782 Mixed hyperlipidemia: Secondary | ICD-10-CM | POA: Diagnosis not present

## 2017-01-23 LAB — POCT GLYCOSYLATED HEMOGLOBIN (HGB A1C): HEMOGLOBIN A1C: 6.1

## 2017-01-23 NOTE — Progress Notes (Signed)
Subjective  Patient is presenting for her first visit at the [facility] for many years.  Has been having her care with [facility].  I see her husband   HYPERTENSION Disease Monitoring: Blood pressure range-not checking Chest pain, palpitations- no      Dyspnea- no Medications: Compliance- brings in her meds Lightheadedness,Syncope- no   Edema- no  DIABETES Disease Monitoring: Blood Sugar ranges-not checking Polyuria/phagia/dipsia- no      Visual problems- no Medications: Compliance- has her meds Hypoglycemic symptoms- no  HYPERLIPIDEMIA Disease Monitoring: See symptoms for Hypertension Medications: Compliance- pravastatin at night Right upper quadrant pain- non  Muscle aches- no  Monitoring Labs and Parameters Last A1C:  Lab Results  Component Value Date   HGBA1C 6.1 04/08/2012    Last Lipid:     Component Value Date/Time   CHOL 194 12/14/2011 0927   HDL 45 12/14/2011 0927    Last Bmet  Potassium  Date Value Ref Range Status  12/14/2011 4.2 3.5 - 5.3 mEq/L Final   Sodium  Date Value Ref Range Status  12/14/2011 139 135 - 145 mEq/L Final   Creat  Date Value Ref Range Status  12/14/2011 0.72 0.50 - 1.10 mg/dL Final      Last BPs:  BP Readings from Last 3 Encounters:  01/23/17 132/76  04/21/12 (!) 145/81  04/10/12 112/62   Patient reports no  vision/ hearing changes,anorexia, weight change, fever ,adenopathy, persistant / recurrent hoarseness, swallowing issues, chest pain, edema,persistant / recurrent cough, hemoptysis, dyspnea(rest, exertional, paroxysmal nocturnal), gastrointestinal  bleeding (melena, rectal bleeding), abdominal pain, excessive heart burn, GU symptoms(dysuria, hematuria, pyuria, voiding/incontinence  Issues) syncope, focal weakness, severe memory loss, concerning skin lesions, depression, anxiety, abnormal bruising/bleeding, major joint swelling, breast masses or abnormal vaginal bleeding.    Chief Complaint noted Review of Symptoms -  see HPI PMH - Smoking status noted.     Objective Vital Signs reviewed Eye - Pupils Equal Round Reactive to light, Extraocular movements intact,  Conjunctiva without redness or discharge Neck:  No deformities, thyromegaly, masses, or tenderness noted.   Supple with full range of motion without pain. Heart - Regular rate and rhythm.  No murmurs, gallops or rubs.    Lungs:  Normal respiratory effort, chest expands symmetrically. Lungs are clear to auscultation, no crackles or wheezes. Abdomen: soft and non-tender without masses, organomegaly or hernias noted.  No guarding or rebound Extremities:  No cyanosis, edema, or deformity noted with good range of motion of all major joints.   Skin:  Intact without suspicious lesions or rashes Psych:  Cognition and judgment appear intact. Alert, communicative  and cooperative with normal attention span and concentration. No apparent delusions, illusions, hallucinations Speaks some English and her husband also helps.  They did not want an interpreter     Assessments/Plans  No problem-specific Assessment & Plan notes found for this encounter.   See Encounter view if individual problem A/Ps not visible See after visit summary for details of patient instuctions

## 2017-01-23 NOTE — Patient Instructions (Signed)
Good to see you today!  Thanks for coming in.  I will call you if your lab tests are not normal.  Otherwise we will discuss them at your next visit.  I will refill your medications  Take Colace (docusate) one every day for your bowels  See your eye doctor for a diabetes eye check  Come back in one month to go over your blood tests and medicines

## 2017-01-23 NOTE — Telephone Encounter (Signed)
I dont see that any medications were sent in today, will forward to MD, pharmacy changed in epic.

## 2017-01-23 NOTE — Telephone Encounter (Signed)
All medications that were prescribed today when pt seen Dr. Erin Hearing were sent to incorrect pharmacy. Pt would like medications sent to the [facility].

## 2017-01-24 ENCOUNTER — Encounter: Payer: Self-pay | Admitting: Family Medicine

## 2017-01-24 DIAGNOSIS — E039 Hypothyroidism, unspecified: Secondary | ICD-10-CM | POA: Insufficient documentation

## 2017-01-24 LAB — COMPREHENSIVE METABOLIC PANEL
ALT: 8 IU/L (ref 0–32)
AST: 18 IU/L (ref 0–40)
Albumin/Globulin Ratio: 1.2 (ref 1.2–2.2)
Albumin: 4.1 g/dL (ref 3.5–4.8)
Alkaline Phosphatase: 88 IU/L (ref 39–117)
BILIRUBIN TOTAL: 0.7 mg/dL (ref 0.0–1.2)
BUN/Creatinine Ratio: 12 (ref 12–28)
BUN: 9 mg/dL (ref 8–27)
CHLORIDE: 102 mmol/L (ref 96–106)
CO2: 26 mmol/L (ref 18–29)
Calcium: 9.4 mg/dL (ref 8.7–10.3)
Creatinine, Ser: 0.75 mg/dL (ref 0.57–1.00)
GFR calc non Af Amer: 80 mL/min/{1.73_m2} (ref >59)
GFR, EST AFRICAN AMERICAN: 92 mL/min/{1.73_m2} (ref >59)
Globulin, Total: 3.3 g/dL (ref 1.5–4.5)
Glucose: 106 mg/dL — ABNORMAL HIGH (ref 65–99)
Potassium: 4.5 mmol/L (ref 3.5–5.2)
Sodium: 144 mmol/L (ref 134–144)
TOTAL PROTEIN: 7.4 g/dL (ref 6.0–8.5)

## 2017-01-24 LAB — CBC
HEMATOCRIT: 31.9 % — AB (ref 34.0–46.6)
Hemoglobin: 9.8 g/dL — ABNORMAL LOW (ref 11.1–15.9)
MCH: 23.7 pg — ABNORMAL LOW (ref 26.6–33.0)
MCHC: 30.7 g/dL — ABNORMAL LOW (ref 31.5–35.7)
MCV: 77 fL — ABNORMAL LOW (ref 79–97)
Platelets: 320 10*3/uL (ref 150–379)
RBC: 4.14 x10E6/uL (ref 3.77–5.28)
RDW: 16.6 % — ABNORMAL HIGH (ref 12.3–15.4)
WBC: 8.1 10*3/uL (ref 3.4–10.8)

## 2017-01-24 LAB — VITAMIN D 25 HYDROXY (VIT D DEFICIENCY, FRACTURES): VIT D 25 HYDROXY: 22.7 ng/mL — AB (ref 30.0–100.0)

## 2017-01-24 LAB — LIPID PANEL
Chol/HDL Ratio: 5 ratio — ABNORMAL HIGH (ref 0.0–4.4)
Cholesterol, Total: 200 mg/dL — ABNORMAL HIGH (ref 100–199)
HDL: 40 mg/dL (ref >39)
LDL CALC: 97 mg/dL (ref 0–99)
TRIGLYCERIDES: 315 mg/dL — AB (ref 0–149)
VLDL Cholesterol Cal: 63 mg/dL — ABNORMAL HIGH (ref 5–40)

## 2017-01-24 MED ORDER — LINACLOTIDE 145 MCG PO CAPS: 145.0000 ug | ORAL_CAPSULE | Freq: Every day | ORAL | 1 refills | Status: DC | PRN

## 2017-01-24 MED ORDER — METOPROLOL SUCCINATE ER 50 MG PO TB24: 50.0000 mg | ORAL_TABLET | Freq: Every day | ORAL | 1 refills | Status: DC

## 2017-01-24 MED ORDER — PRAVASTATIN SODIUM 40 MG PO TABS: 40.0000 mg | ORAL_TABLET | Freq: Every day | ORAL | 3 refills | Status: DC

## 2017-01-24 MED ORDER — METFORMIN HCL ER 500 MG PO TB24: 500.0000 mg | ORAL_TABLET | Freq: Every day | ORAL | 3 refills | Status: DC

## 2017-01-24 MED ORDER — OMEPRAZOLE 40 MG PO CPDR: 40.0000 mg | DELAYED_RELEASE_CAPSULE | Freq: Every day | ORAL | 0 refills | Status: AC | PRN

## 2017-01-24 MED ORDER — HYDROCHLOROTHIAZIDE 12.5 MG PO CAPS: 12.5000 mg | ORAL_CAPSULE | Freq: Every day | ORAL | 1 refills | Status: DC

## 2017-01-24 NOTE — Assessment & Plan Note (Signed)
This was on her PL from prior provider but she does not seem to be on replacement.  Will check TSH next visit

## 2017-01-24 NOTE — Assessment & Plan Note (Signed)
BP: 132/76   Controlled to day will check labs

## 2017-01-24 NOTE — Assessment & Plan Note (Signed)
Check labs today.  Is not fasting

## 2017-01-24 NOTE — Assessment & Plan Note (Signed)
Well controlled on metformin

## 2017-02-06 ENCOUNTER — Other Ambulatory Visit: Payer: Self-pay | Admitting: Family Medicine

## 2017-02-06 ENCOUNTER — Other Ambulatory Visit: Payer: PPO

## 2017-02-06 ENCOUNTER — Encounter: Payer: Self-pay | Admitting: Family Medicine

## 2017-02-06 DIAGNOSIS — D509 Iron deficiency anemia, unspecified: Secondary | ICD-10-CM | POA: Insufficient documentation

## 2017-02-06 DIAGNOSIS — E038 Other specified hypothyroidism: Secondary | ICD-10-CM

## 2017-02-06 DIAGNOSIS — D649 Anemia, unspecified: Secondary | ICD-10-CM

## 2017-02-06 DIAGNOSIS — E559 Vitamin D deficiency, unspecified: Secondary | ICD-10-CM | POA: Insufficient documentation

## 2017-02-07 ENCOUNTER — Encounter: Payer: Self-pay | Admitting: Family Medicine

## 2017-02-07 LAB — CBC
HEMOGLOBIN: 9 g/dL — AB (ref 11.1–15.9)
Hematocrit: 28.5 % — ABNORMAL LOW (ref 34.0–46.6)
MCH: 24.3 pg — AB (ref 26.6–33.0)
MCHC: 31.6 g/dL (ref 31.5–35.7)
MCV: 77 fL — ABNORMAL LOW (ref 79–97)
Platelets: 363 10*3/uL (ref 150–379)
RBC: 3.71 x10E6/uL — ABNORMAL LOW (ref 3.77–5.28)
RDW: 16.6 % — ABNORMAL HIGH (ref 12.3–15.4)
WBC: 8.5 10*3/uL (ref 3.4–10.8)

## 2017-02-07 LAB — FERRITIN: FERRITIN: 12 ng/mL — AB (ref 15–150)

## 2017-02-07 LAB — TSH: TSH: 4.76 u[IU]/mL — AB (ref 0.450–4.500)

## 2017-02-21 DIAGNOSIS — E782 Mixed hyperlipidemia: Secondary | ICD-10-CM | POA: Diagnosis not present

## 2017-02-21 DIAGNOSIS — J4520 Mild intermittent asthma, uncomplicated: Secondary | ICD-10-CM | POA: Diagnosis not present

## 2017-02-21 DIAGNOSIS — I10 Essential (primary) hypertension: Secondary | ICD-10-CM | POA: Diagnosis not present

## 2017-02-21 DIAGNOSIS — E1165 Type 2 diabetes mellitus with hyperglycemia: Secondary | ICD-10-CM | POA: Diagnosis not present

## 2017-02-27 ENCOUNTER — Ambulatory Visit: Payer: PPO | Admitting: Family Medicine

## 2017-03-06 ENCOUNTER — Encounter: Payer: Self-pay | Admitting: Family Medicine

## 2017-03-06 ENCOUNTER — Ambulatory Visit (INDEPENDENT_AMBULATORY_CARE_PROVIDER_SITE_OTHER): Payer: PPO | Admitting: Family Medicine

## 2017-03-06 ENCOUNTER — Encounter: Payer: Self-pay | Admitting: Nurse Practitioner

## 2017-03-06 DIAGNOSIS — I10 Essential (primary) hypertension: Secondary | ICD-10-CM

## 2017-03-06 DIAGNOSIS — D500 Iron deficiency anemia secondary to blood loss (chronic): Secondary | ICD-10-CM

## 2017-03-06 DIAGNOSIS — Z23 Encounter for immunization: Secondary | ICD-10-CM | POA: Diagnosis not present

## 2017-03-06 DIAGNOSIS — Z1159 Encounter for screening for other viral diseases: Secondary | ICD-10-CM

## 2017-03-06 DIAGNOSIS — E039 Hypothyroidism, unspecified: Secondary | ICD-10-CM

## 2017-03-06 NOTE — Assessment & Plan Note (Signed)
Not at goal but may be due to anemia.  May need to add ACEI next visit if still high

## 2017-03-06 NOTE — Progress Notes (Signed)
Subjective  Patient is presenting with the following illnesses  Anemia Feels some shortness of breath when walking but is improving.  No chest pain or lightheadness.  Taking iron daily.  Taking asa daily.  No overt bleeding noticed  Hypothyroid Weight is unchanged.  No temperature intolerance.  No hair change  HYPERTENSION Disease Monitoring  Home BP Monitoring (Severity) not checking Symptoms - Chest pain- no    Dyspnea- see anemia Medications (Modifying factors) Compliance-  daily. Lightheadedness-  no  Edema- no Timing - continuous  Duration - years ROS - See HPI  PMH Lab Review   Potassium  Date Value Ref Range Status  01/23/2017 4.5 3.5 - 5.2 mmol/L Final   Sodium  Date Value Ref Range Status  01/23/2017 144 134 - 144 mmol/L Final   Creat  Date Value Ref Range Status  12/14/2011 0.72 0.50 - 1.10 mg/dL Final   Creatinine, Ser  Date Value Ref Range Status  01/23/2017 0.75 0.57 - 1.00 mg/dL Final       Chief Complaint noted Review of Symptoms - see HPI PMH - Smoking status noted.     Objective Vital Signs reviewed Heart - Regular rate and rhythm.  No murmurs, gallops or rubs.    Lungs:  Normal respiratory effort, chest expands symmetrically. Lungs are clear to auscultation, no crackles or wheezes. Diabetic Foot Check -  Appearance - no lesions, ulcers or calluses Skin - no unusual pallor or redness Monofilament testing -  Right - Great toe, medial, central, lateral ball and posterior foot intact Left - Great toe, medial, central, lateral ball and posterior foot intact     Assessments/Plans  No problem-specific Assessment & Plan notes found for this encounter.   See Encounter view if individual problem A/Ps not visible See after visit summary for details of patient instuctions

## 2017-03-06 NOTE — Assessment & Plan Note (Signed)
Stable TSH very slightly high.  Asymptomatic. Will repeat next visit

## 2017-03-06 NOTE — Assessment & Plan Note (Signed)
Likely due to GI bleed.  Will refer for evaluation.  Stop asa.  Increase iron.  Check cbc today

## 2017-03-06 NOTE — Patient Instructions (Addendum)
Good to see you today!  Thanks for coming in.  Contact Dr Posey Pronto - eye doctor - say you need a diabetes eye check  I will refer you to a stomach doctor for your anemia  Take iron tabs twice a day  Stop your aspirin   Come back in 3 months

## 2017-03-07 ENCOUNTER — Encounter: Payer: Self-pay | Admitting: Family Medicine

## 2017-03-07 DIAGNOSIS — H524 Presbyopia: Secondary | ICD-10-CM | POA: Diagnosis not present

## 2017-03-07 DIAGNOSIS — H40023 Open angle with borderline findings, high risk, bilateral: Secondary | ICD-10-CM | POA: Diagnosis not present

## 2017-03-07 LAB — CBC
HEMATOCRIT: 34.1 % (ref 34.0–46.6)
HEMOGLOBIN: 10.6 g/dL — AB (ref 11.1–15.9)
MCH: 25.2 pg — ABNORMAL LOW (ref 26.6–33.0)
MCHC: 31.1 g/dL — AB (ref 31.5–35.7)
MCV: 81 fL (ref 79–97)
Platelets: 323 10*3/uL (ref 150–379)
RBC: 4.2 x10E6/uL (ref 3.77–5.28)
RDW: 20 % — AB (ref 12.3–15.4)
WBC: 10.2 10*3/uL (ref 3.4–10.8)

## 2017-03-07 LAB — HEPATITIS C ANTIBODY

## 2017-03-13 ENCOUNTER — Encounter: Payer: Self-pay | Admitting: Nurse Practitioner

## 2017-03-13 ENCOUNTER — Ambulatory Visit (INDEPENDENT_AMBULATORY_CARE_PROVIDER_SITE_OTHER): Payer: PPO | Admitting: Nurse Practitioner

## 2017-03-13 VITALS — BP 120/72 | Ht 60.0 in | Wt 168.0 lb

## 2017-03-13 DIAGNOSIS — K219 Gastro-esophageal reflux disease without esophagitis: Secondary | ICD-10-CM | POA: Diagnosis not present

## 2017-03-13 DIAGNOSIS — D509 Iron deficiency anemia, unspecified: Secondary | ICD-10-CM | POA: Diagnosis not present

## 2017-03-13 DIAGNOSIS — K5900 Constipation, unspecified: Secondary | ICD-10-CM

## 2017-03-13 DIAGNOSIS — K648 Other hemorrhoids: Secondary | ICD-10-CM | POA: Diagnosis not present

## 2017-03-13 MED ORDER — HYDROCORTISONE 2.5 % RE CREA: 1.0000 "application " | TOPICAL_CREAM | Freq: Every day | RECTAL | 0 refills | Status: DC

## 2017-03-13 MED ORDER — NA SULFATE-K SULFATE-MG SULF 17.5-3.13-1.6 GM/177ML PO SOLN: 1.0000 | Freq: Once | ORAL | 0 refills | Status: AC

## 2017-03-13 NOTE — Patient Instructions (Addendum)
If you are age 73 or older, your body mass index should be between 23-30. Your Body mass index is 32.81 kg/m. If this is out of the aforementioned range listed, please consider follow up with your Primary Care Provider.  If you are age 37 or younger, your body mass index should be between 19-25. Your Body mass index is 32.81 kg/m. If this is out of the aformentioned range listed, please consider follow up with your Primary Care Provider.   You have been scheduled for an endoscopy and colonoscopy. Please follow the written instructions given to you at your visit today. Please pick up your prep supplies at the pharmacy within the next 1-3 days. If you use inhalers (even only as needed), please bring them with you on the day of your procedure. Your physician has requested that you go to www.startemmi.com and enter the access code given to you at your visit today. This web site gives a general overview about your procedure. However, you should still follow specific instructions given to you by our office regarding your preparation for the procedure.  Your physician has requested that you go to the basement for lab work in three weeks, April 03, 2017 for repeat labs.  We have sent the following medications to your pharmacy for you to pick up at your convenience: Anusol Cream  HOLD [place].  Thank you for choosing me and [facility].  Tye Savoy, NP

## 2017-03-13 NOTE — Progress Notes (Signed)
HPI: Patient is a 71 year old female from [place] here with Daughter in [place] who helps translate. Patient is referred by PCP Dr. Tamela Oddi for evaluation of anemia. She has chronic anemia, borderline microcytic. In 2011 hemoglobin was 12.1. No labs in between then and April of this year when hemoglobin was 9.8 with ferritin of 12. Per patient's Daughter -in -Law hgb done in [place] last year was in normal range.  PCP started oral iron in April.  Hgb on 5/16 was up to 10.6. Patient was taking a daily baby ASA until PCP recently stopped it. No other NSAIDS.  She has not seen any blood in her stools. Stools now black on iron. No abdominal pain or weight loss. She has had problems with constipation and hemorrhoids in the past. She's never had a colonoscopy. There is no family history of colon or stomach cancer.   Past Medical History:  Diagnosis Date  . Diabetes mellitus   . Hyperlipidemia   . Hypertension     Past Surgical History:  Procedure Laterality Date  . CATARACT EXTRACTION Bilateral   . MINOR HEMORRHOIDECTOMY  2009   Family History  Problem Relation Age of Onset  . Diabetes Father    Social History  Substance Use Topics  . Smoking status: Never Smoker  . Smokeless tobacco: Never Used  . Alcohol use No   Current Outpatient Prescriptions  Medication Sig Dispense Refill  . cholecalciferol (VITAMIN D) 400 units TABS tablet Take 400 Units by mouth.    . ferrous sulfate 325 (65 FE) MG tablet Take 325 mg by mouth daily with breakfast.    . hydrochlorothiazide (MICROZIDE) 12.5 MG capsule Take 1 capsule (12.5 mg total) by mouth daily. 90 capsule 1  . metFORMIN (GLUCOPHAGE-XR) 500 MG 24 hr tablet Take 1 tablet (500 mg total) by mouth daily with breakfast. 90 tablet 3  . metoprolol succinate (TOPROL-XL) 50 MG 24 hr tablet Take 1 tablet (50 mg total) by mouth daily. Take with or immediately following a meal. 90 tablet 1  . omeprazole (PRILOSEC) 40 MG capsule Take 1 capsule  (40 mg total) by mouth daily as needed. 90 capsule 0  . OVER THE COUNTER MEDICATION STOOL SOFTNER    . pravastatin (PRAVACHOL) 40 MG tablet Take 1 tablet (40 mg total) by mouth daily. 90 tablet 3  . hydrocortisone (ANUSOL-HC) 2.5 % rectal cream Place 1 application rectally at bedtime. For seven days only 30 g 0   No current facility-administered medications for this visit.    No Known Allergies   Review of Systems: Positive for allergy and cough All systems reviewed and negative except where noted in HPI.   Physical Exam: BP 120/72   Ht 5' (1.524 m)   Wt 168 lb (76.2 kg)   BMI 32.81 kg/m  Constitutional:  Well-developed female in no acute distress. Psychiatric: Normal mood and affect. Behavior is normal. EENT:  Conjunctivae are normal. No scleral icterus. Neck supple.  Cardiovascular: Normal rate, regular rhythm.  Pulmonary/chest: Effort normal and breath sounds normal. No wheezing, rales or rhonchi. Abdominal: Soft, nondistended, nontender. Bowel sounds active throughout. There are no masses palpable. Rectal: small protruding internal hemorrhoid. Scant brown, heme negative stool Extremities: no edema Lymphadenopathy: No cervical adenopathy noted. Neurological: Alert and oriented to person place and time. Skin: Skin is warm and dry. No rashes noted.   ASSESSMENT AND PLAN:  64. 71 yo female from [place] referred for anemia. She is iron deficient with ferritin of  12.  -For further evaluation patient will be scheduled for EGD and colonoscopy. She speaks Hindi, would like a female physician so will schedule with Dr. Silverio Decamp.  The risks and benefits of the EGD and colonoscopy with possible polypectomy were discussed with the patient and Daughter - in - Law and patient agrees to proceed.  -hold iron prior to colonoscopy   2. Chronic, intermittent constipation.Protruding internal hemorrhoid.  -Just started stool softener and bowels moving better. She has MIralax at home to use as  needed -anusol cream x 7 days for hemorrhoid  3. GERD. Occasional heartburn mainly dependent on diet. She takes omeprazole as needed   Tye Savoy, NP  03/13/2017, 10:29 AM  Cc:  Lind Covert, *

## 2017-03-21 NOTE — Progress Notes (Signed)
Reviewed and agree with documentation and assessment and plan. K. Veena Nandigam , MD   

## 2017-03-26 ENCOUNTER — Encounter: Payer: Self-pay | Admitting: Gastroenterology

## 2017-03-26 DIAGNOSIS — I10 Essential (primary) hypertension: Secondary | ICD-10-CM | POA: Diagnosis not present

## 2017-03-26 DIAGNOSIS — E782 Mixed hyperlipidemia: Secondary | ICD-10-CM | POA: Diagnosis not present

## 2017-03-26 DIAGNOSIS — J4520 Mild intermittent asthma, uncomplicated: Secondary | ICD-10-CM | POA: Diagnosis not present

## 2017-03-26 DIAGNOSIS — E1165 Type 2 diabetes mellitus with hyperglycemia: Secondary | ICD-10-CM | POA: Diagnosis not present

## 2017-04-08 ENCOUNTER — Ambulatory Visit (AMBULATORY_SURGERY_CENTER): Payer: PPO | Admitting: Gastroenterology

## 2017-04-08 ENCOUNTER — Other Ambulatory Visit: Payer: Self-pay

## 2017-04-08 ENCOUNTER — Encounter: Payer: Self-pay | Admitting: Gastroenterology

## 2017-04-08 VITALS — BP 164/83 | HR 61 | Temp 98.7°F | Resp 18 | Ht 60.0 in | Wt 168.0 lb

## 2017-04-08 DIAGNOSIS — K319 Disease of stomach and duodenum, unspecified: Secondary | ICD-10-CM | POA: Diagnosis not present

## 2017-04-08 DIAGNOSIS — D128 Benign neoplasm of rectum: Secondary | ICD-10-CM

## 2017-04-08 DIAGNOSIS — K621 Rectal polyp: Secondary | ICD-10-CM

## 2017-04-08 DIAGNOSIS — D122 Benign neoplasm of ascending colon: Secondary | ICD-10-CM

## 2017-04-08 DIAGNOSIS — D508 Other iron deficiency anemias: Secondary | ICD-10-CM

## 2017-04-08 DIAGNOSIS — D129 Benign neoplasm of anus and anal canal: Secondary | ICD-10-CM

## 2017-04-08 DIAGNOSIS — K2950 Unspecified chronic gastritis without bleeding: Secondary | ICD-10-CM | POA: Diagnosis not present

## 2017-04-08 MED ORDER — SODIUM CHLORIDE 0.9 % IV SOLN: 500.0000 mL | INTRAVENOUS | Status: AC

## 2017-04-08 NOTE — Progress Notes (Signed)
To recovery, report to Jones, RN, VSS 

## 2017-04-08 NOTE — Op Note (Signed)
[place] Patient Name: Jacqueline Hart Procedure Date: 04/08/2017 1:56 PM MRN: [id number] Endoscopist: Mauri Pole , MD Age: 71 Referring MD:  Date of Birth: 01/14/1946 Gender: Female Account #: [id number] Procedure:                Colonoscopy Indications:              Unexplained iron deficiency anemia Medicines:                Monitored Anesthesia Care Procedure:                Pre-Anesthesia Assessment:                           - Prior to the procedure, a History and Physical                            was performed, and patient medications and                            allergies were reviewed. The patient's tolerance of                            previous anesthesia was also reviewed. The risks                            and benefits of the procedure and the sedation                            options and risks were discussed with the patient.                            All questions were answered, and informed consent                            was obtained. Prior Anticoagulants: The patient has                            taken no previous anticoagulant or antiplatelet                            agents. ASA Grade Assessment: II - A patient with                            mild systemic disease. After reviewing the risks                            and benefits, the patient was deemed in                            satisfactory condition to undergo the procedure.                           - Prior to the procedure, a History and Physical  was performed, and patient medications and                            allergies were reviewed. The patient's tolerance of                            previous anesthesia was also reviewed. The risks                            and benefits of the procedure and the sedation                            options and risks were discussed with the patient.                            All questions were answered, and  informed consent                            was obtained. Prior Anticoagulants: The patient has                            taken no previous anticoagulant or antiplatelet                            agents. ASA Grade Assessment: II - A patient with                            mild systemic disease. After reviewing the risks                            and benefits, the patient was deemed in                            satisfactory condition to undergo the procedure.                           After obtaining informed consent, the colonoscope                            was passed under direct vision. Throughout the                            procedure, the patient's blood pressure, pulse, and                            oxygen saturations were monitored continuously. The                            Colonoscope was introduced through the anus and                            advanced to the the cecum, identified by  appendiceal orifice and ileocecal valve. The                            colonoscopy was performed without difficulty. The                            patient tolerated the procedure well. The quality                            of the bowel preparation was excellent. The                            ileocecal valve, appendiceal orifice, and rectum                            were photographed. Scope In: 1:57:31 PM Scope Out: 2:11:58 PM Scope Withdrawal Time: 0 hours 11 minutes 57 seconds  Total Procedure Duration: 0 hours 14 minutes 27 seconds  Findings:                 The perianal and digital rectal examinations were                            normal.                           A 5 mm polyp was found in the ascending colon. The                            polyp was sessile. The polyp was removed with a                            cold snare. Resection and retrieval were complete.                           A 2 mm polyp was found in the rectum. The polyp was                             sessile. The polyp was removed with a cold biopsy                            forceps. Resection and retrieval were complete.                           Non-bleeding internal hemorrhoids were found during                            retroflexion. The hemorrhoids were small.                           The exam was otherwise without abnormality. Complications:            No immediate complications. Estimated Blood Loss:     Estimated blood loss was minimal. Impression:               -  One 5 mm polyp in the ascending colon, removed                            with a cold snare. Resected and retrieved.                           - One 2 mm polyp in the rectum, removed with a cold                            biopsy forceps. Resected and retrieved.                           - Non-bleeding internal hemorrhoids.                           - The examination was otherwise normal. Recommendation:           - Patient has a contact number available for                            emergencies. The signs and symptoms of potential                            delayed complications were discussed with the                            patient. Return to normal activities tomorrow.                            Written discharge instructions were provided to the                            patient.                           - Resume previous diet.                           - Continue present medications.                           - Await pathology results.                           - Repeat colonoscopy in 5-10 years for surveillance                            based on pathology results.                           - Return to GI office at the next available                            appointment.                           -  If continues to have persistent iron deficiency                            anemia with evidence of GI blood loss, will                            consider small bowel video capsule for  further                            investigation Mauri Pole, MD 04/08/2017 2:20:55 PM This report has been signed electronically.

## 2017-04-08 NOTE — Patient Instructions (Signed)
YOU HAD AN ENDOSCOPIC PROCEDURE TODAY AT [facility]:   Refer to the procedure report that was given to you for any specific questions about what was found during the examination.  If the procedure report does not answer your questions, please call your gastroenterologist to clarify.  If you requested that your care partner not be given the details of your procedure findings, then the procedure report has been included in a sealed envelope for you to review at your convenience later.  YOU SHOULD EXPECT: Some feelings of bloating in the abdomen. Passage of more gas than usual.  Walking can help get rid of the air that was put into your GI tract during the procedure and reduce the bloating. If you had a lower endoscopy (such as a colonoscopy or flexible sigmoidoscopy) you may notice spotting of blood in your stool or on the toilet paper. If you underwent a bowel prep for your procedure, you may not have a normal bowel movement for a few days.  Please Note:  You might notice some irritation and congestion in your nose or some drainage.  This is from the oxygen used during your procedure.  There is no need for concern and it should clear up in a day or so.  SYMPTOMS TO REPORT IMMEDIATELY:   Following lower endoscopy (colonoscopy or flexible sigmoidoscopy):  Excessive amounts of blood in the stool  Significant tenderness or worsening of abdominal pains  Swelling of the abdomen that is new, acute  Fever of 100F or higher   Following upper endoscopy (EGD)  Vomiting of blood or coffee ground material  New chest pain or pain under the shoulder blades  Painful or persistently difficult swallowing  New shortness of breath  Fever of 100F or higher  Black, tarry-looking stools  For urgent or emergent issues, a gastroenterologist can be reached at any hour by calling [phone].   DIET:  We do recommend a small meal at first, but then you may proceed to your regular diet.  Drink  plenty of fluids but you should avoid alcoholic beverages for 24 hours.  ACTIVITY:  You should plan to take it easy for the rest of today and you should NOT DRIVE or use heavy machinery until tomorrow (because of the sedation medicines used during the test).    FOLLOW UP: Our staff will call the number listed on your records the next business day following your procedure to check on you and address any questions or concerns that you may have regarding the information given to you following your procedure. If we do not reach you, we will leave a message.  However, if you are feeling well and you are not experiencing any problems, there is no need to return our call.  We will assume that you have returned to your regular daily activities without incident.  If any biopsies were taken you will be contacted by phone or by letter within the next 1-3 weeks.  Please call us at [phone] if you have not heard about the biopsies in 3 weeks.   Await for biopsy results to determine next repeat Colonoscopy screening Polyps (handout given) Hemorrhoids and Hemorrhoids banding (handout given) Office visit May 28, 2016 at 3:30    No Ibuprofen, Naproxen, or there non-steriodal anti-inflammatory drugs   SIGNATURES/CONFIDENTIALITY: You and/or your care partner have signed paperwork which will be entered into your electronic medical record.  These signatures attest to the fact that that the information above on  your After Visit Summary has been reviewed and is understood.  Full responsibility of the confidentiality of this discharge information lies with you and/or your care-partner. 

## 2017-04-08 NOTE — Progress Notes (Signed)
Called to room to assist during endoscopic procedure.  Patient ID and intended procedure confirmed with present staff. Received instructions for my participation in the procedure from the performing physician.  

## 2017-04-08 NOTE — Op Note (Signed)
[facility] Patient Name: Jacqueline Hart Procedure Date: 04/08/2017 1:39 PM MRN: [id number] Endoscopist: Mauri Pole , MD Age: 71 Referring MD:  Date of Birth: 1946/08/02 Gender: Female Account #: [id number] Procedure:                Upper GI endoscopy Indications:              Suspected upper gastrointestinal bleeding in                            patient with unexplained iron deficiency anemia Medicines:                Monitored Anesthesia Care Procedure:                Pre-Anesthesia Assessment:                           - Prior to the procedure, a History and Physical                            was performed, and patient medications and                            allergies were reviewed. The patient's tolerance of                            previous anesthesia was also reviewed. The risks                            and benefits of the procedure and the sedation                            options and risks were discussed with the patient.                            All questions were answered, and informed consent                            was obtained. Prior Anticoagulants: The patient has                            taken no previous anticoagulant or antiplatelet                            agents. ASA Grade Assessment: II - A patient with                            mild systemic disease. After reviewing the risks                            and benefits, the patient was deemed in                            satisfactory condition to undergo the procedure.  After obtaining informed consent, the endoscope was                            passed under direct vision. Throughout the                            procedure, the patient's blood pressure, pulse, and                            oxygen saturations were monitored continuously. The                            Model GIF-HQ190 [id number]) scope was introduced                            through  the mouth, and advanced to the second part                            of duodenum. The upper GI endoscopy was                            accomplished without difficulty. The patient                            tolerated the procedure well. Scope In: Scope Out: Findings:                 The esophagus was normal.                           Striped mildly erythematous mucosa without bleeding                            was found in the gastric antrum. Biopsies were                            taken with a cold forceps for Helicobacter pylori                            testing.                           The first portion of the duodenum and second                            portion of the duodenum were normal. Biopsies for                            histology were taken with a cold forceps for                            evaluation of celiac disease. Complications:            No immediate complications. Estimated Blood Loss:     Estimated blood loss was minimal. Impression:               -  Normal esophagus.                           - Erythematous mucosa in the antrum. Biopsied.                           - Normal first portion of the duodenum and second                            portion of the duodenum. Biopsied. Recommendation:           - Patient has a contact number available for                            emergencies. The signs and symptoms of potential                            delayed complications were discussed with the                            patient. Return to normal activities tomorrow.                            Written discharge instructions were provided to the                            patient.                           - Resume previous diet.                           - Continue present medications.                           - Await pathology results.                           - No ibuprofen, naproxen, or other non-steroidal                            anti-inflammatory  drugs. Mauri Pole, MD 04/08/2017 2:17:51 PM This report has been signed electronically.

## 2017-04-08 NOTE — Progress Notes (Signed)
Pt's states no medical or surgical changes since previsit or office visit. 

## 2017-04-09 ENCOUNTER — Telehealth: Payer: Self-pay | Admitting: *Deleted

## 2017-04-09 ENCOUNTER — Encounter: Payer: PPO | Admitting: Internal Medicine

## 2017-04-09 ENCOUNTER — Telehealth: Payer: Self-pay

## 2017-04-09 NOTE — Telephone Encounter (Signed)
  Follow up Call-  Call back number 04/08/2017  Post procedure Call Back phone  # [phone] daughter in law cell number may leave message.  Permission to leave phone message Yes  Some recent data might be hidden     Patient questions:  Do you have a fever, pain , or abdominal swelling? No. Pain Score  0 *  Have you tolerated food without any problems? Yes.    Have you been able to return to your normal activities? Yes.    Do you have any questions about your discharge instructions: Diet   No. Medications  No. Follow up visit  No.  Do you have questions or concerns about your Care? No.  Actions: * If pain score is 4 or above: No action needed, pain <4.

## 2017-04-09 NOTE — Telephone Encounter (Signed)
  Follow up Call-  Call back number 04/08/2017  Post procedure Call Back phone  # [phone] daughter in law cell number may leave message.  Permission to leave phone message Yes  Some recent data might be hidden     Patient questions:  "The wireless customer you've tried to reach is unavailable." No VM available.

## 2017-04-12 ENCOUNTER — Encounter: Payer: Self-pay | Admitting: Gastroenterology

## 2017-04-23 DIAGNOSIS — J4520 Mild intermittent asthma, uncomplicated: Secondary | ICD-10-CM | POA: Diagnosis not present

## 2017-04-23 DIAGNOSIS — E782 Mixed hyperlipidemia: Secondary | ICD-10-CM | POA: Diagnosis not present

## 2017-04-23 DIAGNOSIS — I10 Essential (primary) hypertension: Secondary | ICD-10-CM | POA: Diagnosis not present

## 2017-04-23 DIAGNOSIS — E1165 Type 2 diabetes mellitus with hyperglycemia: Secondary | ICD-10-CM | POA: Diagnosis not present

## 2017-05-15 ENCOUNTER — Telehealth: Payer: Self-pay | Admitting: Family Medicine

## 2017-05-15 ENCOUNTER — Ambulatory Visit (INDEPENDENT_AMBULATORY_CARE_PROVIDER_SITE_OTHER): Payer: PPO | Admitting: Family Medicine

## 2017-05-15 ENCOUNTER — Encounter: Payer: Self-pay | Admitting: Family Medicine

## 2017-05-15 DIAGNOSIS — E119 Type 2 diabetes mellitus without complications: Secondary | ICD-10-CM

## 2017-05-15 DIAGNOSIS — D500 Iron deficiency anemia secondary to blood loss (chronic): Secondary | ICD-10-CM

## 2017-05-15 DIAGNOSIS — I10 Essential (primary) hypertension: Secondary | ICD-10-CM | POA: Diagnosis not present

## 2017-05-15 DIAGNOSIS — E039 Hypothyroidism, unspecified: Secondary | ICD-10-CM | POA: Diagnosis not present

## 2017-05-15 LAB — POCT GLYCOSYLATED HEMOGLOBIN (HGB A1C): HEMOGLOBIN A1C: 6.1

## 2017-05-15 NOTE — Assessment & Plan Note (Signed)
At goal.  Encouraged weight loss

## 2017-05-15 NOTE — Progress Notes (Addendum)
Subjective  Patient is presenting with the following illnesses  ANEMIA Has not noticed any bleeding.  No lightheadness with standing or edema.  GI work up was unrevealing.  Taking iron - one tab a day   DIABETES Disease Monitoring: Blood Sugar ranges(Severity) -not checking  Associated Symptoms- Polyuria/phagia/dipsia- no      Visual problems- no Medications: Compliance(Modifying factor) - daily metformin Hypoglycemic symptoms- no Timing - continuous  HYPERTENSION Disease Monitoring  Home BP Monitoring (Severity) not checking Symptoms - Chest pain- no    Dyspnea- no Medications (Modifying factors) Compliance-  daily. Lightheadedness-  no  Edema- no Timing - continuous  Duration - years ROS - See HPI   Monitoring Labs and Parameters Last A1C:  Lab Results  Component Value Date   HGBA1C 6.1 01/23/2017   Last Lipid:     Component Value Date/Time   CHOL 200 (H) 01/23/2017 0940   HDL 40 01/23/2017 0940   Last Bmet  Potassium  Date Value Ref Range Status  01/23/2017 4.5 3.5 - 5.2 mmol/L Final   Sodium  Date Value Ref Range Status  01/23/2017 144 134 - 144 mmol/L Final   Creat  Date Value Ref Range Status  12/14/2011 0.72 0.50 - 1.10 mg/dL Final   Creatinine, Ser  Date Value Ref Range Status  01/23/2017 0.75 0.57 - 1.00 mg/dL Final      Chief Complaint noted Review of Symptoms - see HPI PMH - Smoking status noted.     Objective Vital Signs reviewed Alert nad Heart - Regular rate and rhythm.  No murmurs, gallops or rubs.    Extremities:  No cyanosis, edema, or deformity noted with good range of motion of all major joints.   Scalp - cyst on the R scalp area.  Firm smooth Diabetic Foot Check -  Appearance - no lesions, ulcers or calluses Skin - no unusual pallor or redness Monofilament testing -  Right - Great toe, medial, central, lateral ball and posterior foot intact Left - Great toe, medial, central, lateral ball and posterior foot  intact     Assessments/Plans  No problem-specific Assessment & Plan notes found for this encounter.   See Encounter view if individual problem A/Ps not visible See after visit summary for details of patient instuctions

## 2017-05-15 NOTE — Assessment & Plan Note (Signed)
Check labs today.

## 2017-05-15 NOTE — Assessment & Plan Note (Signed)
Controlled See after visit summary about weight loss

## 2017-05-15 NOTE — Patient Instructions (Addendum)
Good to see you today!  Thanks for coming in.  Please ask your son the name of your eye doctor and call and tell us  I will call you if your tests are not good.  Otherwise I will send you a letter.  If you do not hear from me with in 2 weeks please call our office.     If your shortness of breath is getting worse then come back an see [place]  Try to lose about 1 lb a week to get to 150lbs  Come back in 3 month

## 2017-05-15 NOTE — Telephone Encounter (Signed)
Patient asked me to let you know that her eye doctor is Dr. Virgina Evener, thanks

## 2017-05-16 LAB — TSH: TSH: 5.48 u[IU]/mL — ABNORMAL HIGH (ref 0.450–4.500)

## 2017-05-16 LAB — CBC
HEMATOCRIT: 39.8 % (ref 34.0–46.6)
HEMOGLOBIN: 13.6 g/dL (ref 11.1–15.9)
MCH: 29.2 pg (ref 26.6–33.0)
MCHC: 34.2 g/dL (ref 31.5–35.7)
MCV: 86 fL (ref 79–97)
Platelets: 281 10*3/uL (ref 150–379)
RBC: 4.65 x10E6/uL (ref 3.77–5.28)
RDW: 16 % — ABNORMAL HIGH (ref 12.3–15.4)
WBC: 9.2 10*3/uL (ref 3.4–10.8)

## 2017-05-16 LAB — FERRITIN: Ferritin: 36 ng/mL (ref 15–150)

## 2017-05-16 LAB — T4, FREE: FREE T4: 0.92 ng/dL (ref 0.82–1.77)

## 2017-05-17 ENCOUNTER — Encounter: Payer: Self-pay | Admitting: Family Medicine

## 2017-05-28 ENCOUNTER — Encounter: Payer: PPO | Admitting: Gastroenterology

## 2017-06-06 ENCOUNTER — Other Ambulatory Visit: Payer: Self-pay | Admitting: Family Medicine

## 2017-08-19 DIAGNOSIS — J4520 Mild intermittent asthma, uncomplicated: Secondary | ICD-10-CM | POA: Diagnosis not present

## 2017-08-19 DIAGNOSIS — E782 Mixed hyperlipidemia: Secondary | ICD-10-CM | POA: Diagnosis not present

## 2017-08-19 DIAGNOSIS — I10 Essential (primary) hypertension: Secondary | ICD-10-CM | POA: Diagnosis not present

## 2017-08-19 DIAGNOSIS — E1165 Type 2 diabetes mellitus with hyperglycemia: Secondary | ICD-10-CM | POA: Diagnosis not present

## 2017-08-21 ENCOUNTER — Ambulatory Visit (INDEPENDENT_AMBULATORY_CARE_PROVIDER_SITE_OTHER): Payer: PPO | Admitting: Family Medicine

## 2017-08-21 ENCOUNTER — Encounter: Payer: Self-pay | Admitting: Family Medicine

## 2017-08-21 ENCOUNTER — Ambulatory Visit: Payer: PPO | Admitting: Family Medicine

## 2017-08-21 VITALS — BP 132/88 | HR 61 | Temp 97.6°F | Ht 60.0 in | Wt 166.6 lb

## 2017-08-21 DIAGNOSIS — E119 Type 2 diabetes mellitus without complications: Secondary | ICD-10-CM | POA: Diagnosis not present

## 2017-08-21 DIAGNOSIS — Z23 Encounter for immunization: Secondary | ICD-10-CM | POA: Diagnosis not present

## 2017-08-21 DIAGNOSIS — E2839 Other primary ovarian failure: Secondary | ICD-10-CM

## 2017-08-21 DIAGNOSIS — E785 Hyperlipidemia, unspecified: Secondary | ICD-10-CM

## 2017-08-21 DIAGNOSIS — I10 Essential (primary) hypertension: Secondary | ICD-10-CM

## 2017-08-21 LAB — POCT GLYCOSYLATED HEMOGLOBIN (HGB A1C): HEMOGLOBIN A1C: 5.8

## 2017-08-21 MED ORDER — FLUTICASONE PROPIONATE 50 MCG/ACT NA SUSP: 2.0000 | Freq: Every day | NASAL | 6 refills | Status: DC

## 2017-08-21 MED ORDER — BENZONATATE 100 MG PO CAPS: 100.0000 mg | ORAL_CAPSULE | Freq: Two times a day (BID) | ORAL | 1 refills | Status: DC | PRN

## 2017-08-21 NOTE — Assessment & Plan Note (Signed)
Well controlled 

## 2017-08-21 NOTE — Assessment & Plan Note (Signed)
Well controlled - Microalb not indicated

## 2017-08-21 NOTE — Progress Notes (Signed)
Subjective  Patient is presenting with the following illnesses  Cough and Runny nose Gets every year around this time.  No fever or sputum or pain.  Tesalon helps a little  HYPERTENSION Disease Monitoring: Blood pressure range-not checkign Chest pain, palpitations- no      Dyspnea- no Medications: Compliance- reports taking all her meds Lightheadedness,Syncope- no   Edema- no  DIABETES Disease Monitoring: Blood Sugar ranges-not checking Polyuria/phagia/dipsia- no      Visual problems- no Medications: Compliance- metformin daily Hypoglycemic symptoms- no  HYPERLIPIDEMIA Disease Monitoring: See symptoms for Hypertension Medications: Compliance- daily pravastatin Right upper quadrant pain- no  Muscle aches- no  Monitoring Labs and Parameters Last A1C:  Lab Results  Component Value Date   HGBA1C 5.8 08/21/2017    Last Lipid:     Component Value Date/Time   CHOL 200 (H) 01/23/2017 0940   HDL 40 01/23/2017 0940    Last Bmet  Potassium  Date Value Ref Range Status  01/23/2017 4.5 3.5 - 5.2 mmol/L Final   Sodium  Date Value Ref Range Status  01/23/2017 144 134 - 144 mmol/L Final   Creat  Date Value Ref Range Status  12/14/2011 0.72 0.50 - 1.10 mg/dL Final   Creatinine, Ser  Date Value Ref Range Status  01/23/2017 0.75 0.57 - 1.00 mg/dL Final      Last BPs:  BP Readings from Last 3 Encounters:  08/21/17 132/88  05/15/17 138/90  04/08/17 (!) 164/83    Chief Complaint noted Review of Symptoms - see HPI PMH - Smoking status noted.     Objective Vital Signs reviewed Heart - Regular rate and rhythm.  No murmurs, gallops or rubs.    Lungs:  Normal respiratory effort, chest expands symmetrically. Lungs are clear to auscultation, no crackles or wheezes. Extremities:  No cyanosis, edema, or deformity noted with good range of motion of all major joints.       Assessments/Plans  Diabetes mellitus, type 2 Well controlled   ESSENTIAL HYPERTENSION,  BENIGN Well controlled - Microalb not indicated   Hyperlipidemia Well controlled   Rhinitis  - likely allergic - try flonase  See after visit summary for details of patient instuctions

## 2017-08-21 NOTE — Patient Instructions (Addendum)
Good to see you today!  Thanks for coming in.  Use the Flonase once a day 2 sprays each side of your nose for the cough  Come back in 6 months

## 2017-09-05 ENCOUNTER — Ambulatory Visit: Payer: PPO | Admitting: Family Medicine

## 2017-10-03 DIAGNOSIS — I10 Essential (primary) hypertension: Secondary | ICD-10-CM | POA: Diagnosis not present

## 2017-10-03 DIAGNOSIS — E782 Mixed hyperlipidemia: Secondary | ICD-10-CM | POA: Diagnosis not present

## 2017-10-03 DIAGNOSIS — J4520 Mild intermittent asthma, uncomplicated: Secondary | ICD-10-CM | POA: Diagnosis not present

## 2017-10-03 DIAGNOSIS — E1165 Type 2 diabetes mellitus with hyperglycemia: Secondary | ICD-10-CM | POA: Diagnosis not present

## 2017-11-01 ENCOUNTER — Encounter: Payer: Self-pay | Admitting: Family Medicine

## 2017-11-01 ENCOUNTER — Ambulatory Visit (INDEPENDENT_AMBULATORY_CARE_PROVIDER_SITE_OTHER): Payer: Medicare HMO | Admitting: Family Medicine

## 2017-11-01 ENCOUNTER — Other Ambulatory Visit: Payer: Self-pay

## 2017-11-01 VITALS — BP 152/78 | HR 57 | Temp 97.8°F | Ht 60.0 in | Wt 164.6 lb

## 2017-11-01 DIAGNOSIS — E039 Hypothyroidism, unspecified: Secondary | ICD-10-CM | POA: Diagnosis not present

## 2017-11-01 DIAGNOSIS — R42 Dizziness and giddiness: Secondary | ICD-10-CM

## 2017-11-01 MED ORDER — HYDROCHLOROTHIAZIDE 12.5 MG PO CAPS: 12.5000 mg | ORAL_CAPSULE | Freq: Every day | ORAL | 1 refills | Status: AC

## 2017-11-01 MED ORDER — METOPROLOL SUCCINATE ER 50 MG PO TB24: ORAL_TABLET | ORAL | 1 refills | Status: AC

## 2017-11-01 MED ORDER — MECLIZINE HCL 25 MG PO TABS: 25.0000 mg | ORAL_TABLET | Freq: Three times a day (TID) | ORAL | 0 refills | Status: DC | PRN

## 2017-11-01 NOTE — Assessment & Plan Note (Addendum)
New issue for the patient. We are checking blood sugars today via basic metabolic profile as well as electrolytes.  CBC to assess for anemia though she denies any bleeding. Also repeating TSH she has history of hypothyroidism. Meclizine for symptomatic relief.  Careful with this as she is elderly.  Seems to be mostly combination of slight orthostatic symptoms plus BPPV.  She will be scheduled to see her PCP in the next week or so and symptoms will hopefully have resolved by that point.      She has several other complaints including wanting to have a prescription for home pulse ox and home blood pressure machine.  I have deferred these until she can see her PCP.

## 2017-11-01 NOTE — Patient Instructions (Signed)
Your blood pressure is a little high still.  It has been normal in the past.  This could be contributing to your dizziness.  I don't want it to get too low.  Come back next week to see how it is doing.    Take the Meclizine if you're having the dizziness.  If it gets much worse don't hesitate and come back sooner.    We are also checking some labs today.  You'll have these results to discuss with Dr. Erin Hearing.

## 2017-11-01 NOTE — Progress Notes (Signed)
Subjective:    Jacqueline Hart is a 72 y.o. female who presents to [facility] today for dizziness:  1.  Dizziness:  Patient has had early morning dizziness for the past 2 days.  She describes this as both lightheadedness and vertiginous symptoms.  It slowly improves throughout the day or if she sits for a few minutes before standing.  She has been taking all of her medications as prescribed.  She has not missed any medicines.  She has been checking her blood pressure yesterday and noticed it being elevated.  Here in the office it is 836 systolic.  She has had no lower extremity edema.  No palpitations.  No chest pain.  No falls.  No weakness in her extremities.  No paresthesias.  No vaginal or gastrointestinal bleeding that she has noted.  No abdominal pain.  ROS as above per HPI.    The following portions of the patient's history were reviewed and updated as appropriate: allergies, current medications, past medical history, family and social history, and problem list. Patient is a nonsmoker.    PMH reviewed.  Past Medical History:  Diagnosis Date  . Diabetes mellitus   . Hyperlipidemia   . Hypertension    Past Surgical History:  Procedure Laterality Date  . CATARACT EXTRACTION Bilateral   . MINOR HEMORRHOIDECTOMY  2009    Medications reviewed. Current Outpatient Medications  Medication Sig Dispense Refill  . benzonatate (TESSALON) 100 MG capsule Take 1 capsule (100 mg total) by mouth 2 (two) times daily as needed for cough. 20 capsule 1  . cholecalciferol (VITAMIN D) 400 units TABS tablet Take 400 Units by mouth.    . fluticasone (FLONASE) 50 MCG/ACT nasal spray Place 2 sprays into both nostrils daily. 16 g 6  . hydrochlorothiazide (MICROZIDE) 12.5 MG capsule TAKE 1 CAPSULE BY MOUTH ONCE DAILY 90 capsule 1  . Linaclotide (LINZESS PO) Take by mouth.    . metFORMIN (GLUCOPHAGE-XR) 500 MG 24 hr tablet Take 1 tablet (500 mg total) by mouth daily with breakfast. 90 tablet 3  . metoprolol  succinate (TOPROL-XL) 50 MG 24 hr tablet TAKE 1 TABLET BY MOUTH ONCE DAILY. TAKE WITH OR IMMEDIATELY FOLLOWING A MEAL 90 tablet 1  . omeprazole (PRILOSEC) 40 MG capsule Take 1 capsule (40 mg total) by mouth daily as needed. (Patient not taking: Reported on 08/21/2017) 90 capsule 0  . OVER THE COUNTER MEDICATION STOOL SOFTNER    . pravastatin (PRAVACHOL) 40 MG tablet Take 1 tablet (40 mg total) by mouth daily. (Patient taking differently: Take 20 mg by mouth daily. ) 90 tablet 3   Current Facility-Administered Medications  Medication Dose Route Frequency Provider Last Rate Last Dose  . 0.9 %  sodium chloride infusion  500 mL Intravenous Continuous Nandigam, Venia Minks, MD         Objective:   Physical Exam BP (!) 162/78   Pulse (!) 57   Temp 97.8 F (36.6 C) (Oral)   Ht 5' (1.524 m)   Wt 164 lb 9.6 oz (74.7 kg)   SpO2 98%   BMI 32.15 kg/m  Gen:  Alert, cooperative patient who appears stated age in no acute distress.  Vital signs reviewed. HEENT: EOMI.  PERRL.  MMM Cardiac:  Regular rate and rhythm without murmur auscultated.  Good S1/S2. Pulm:  Clear to auscultation bilaterally with good air movement.  No wheezes or rales noted.   Exts: Non edematous BL  LE, warm and well perfused.  Neuro:  Minimal nystagmus  that extinguishes BL.  Cranial nerves good.  Strength and sensation are 5 out of 5 bilateral upper and lower extremities.  Ambulating well without any current dizziness or vertiginous symptoms. No results found for this or any previous visit (from the past 72 hour(s)).

## 2017-11-02 LAB — CBC WITH DIFFERENTIAL/PLATELET
Basophils Absolute: 0 10*3/uL (ref 0.0–0.2)
Basos: 0 %
EOS (ABSOLUTE): 0.1 10*3/uL (ref 0.0–0.4)
Eos: 2 %
Hematocrit: 42.5 % (ref 34.0–46.6)
Hemoglobin: 14 g/dL (ref 11.1–15.9)
IMMATURE GRANS (ABS): 0 10*3/uL (ref 0.0–0.1)
IMMATURE GRANULOCYTES: 0 %
LYMPHS: 33 %
Lymphocytes Absolute: 3 10*3/uL (ref 0.7–3.1)
MCH: 30.9 pg (ref 26.6–33.0)
MCHC: 32.9 g/dL (ref 31.5–35.7)
MCV: 94 fL (ref 79–97)
Monocytes Absolute: 0.6 10*3/uL (ref 0.1–0.9)
Monocytes: 7 %
NEUTROS PCT: 58 %
Neutrophils Absolute: 5.4 10*3/uL (ref 1.4–7.0)
PLATELETS: 279 10*3/uL (ref 150–379)
RBC: 4.53 x10E6/uL (ref 3.77–5.28)
RDW: 13.3 % (ref 12.3–15.4)
WBC: 9.2 10*3/uL (ref 3.4–10.8)

## 2017-11-02 LAB — BASIC METABOLIC PANEL
BUN/Creatinine Ratio: 14 (ref 12–28)
BUN: 11 mg/dL (ref 8–27)
CALCIUM: 9.7 mg/dL (ref 8.7–10.3)
CO2: 26 mmol/L (ref 20–29)
CREATININE: 0.78 mg/dL (ref 0.57–1.00)
Chloride: 99 mmol/L (ref 96–106)
GFR calc Af Amer: 87 mL/min/{1.73_m2} (ref >59)
GFR, EST NON AFRICAN AMERICAN: 76 mL/min/{1.73_m2} (ref >59)
Glucose: 91 mg/dL (ref 65–99)
POTASSIUM: 4.5 mmol/L (ref 3.5–5.2)
Sodium: 142 mmol/L (ref 134–144)

## 2017-11-02 LAB — TSH: TSH: 3.73 u[IU]/mL (ref 0.450–4.500)

## 2017-11-22 ENCOUNTER — Encounter: Payer: Self-pay | Admitting: Family Medicine

## 2017-11-22 LAB — HM DIABETES EYE EXAM

## 2017-11-26 ENCOUNTER — Ambulatory Visit: Payer: Medicare HMO | Admitting: Family Medicine

## 2017-11-27 ENCOUNTER — Ambulatory Visit (INDEPENDENT_AMBULATORY_CARE_PROVIDER_SITE_OTHER): Payer: Medicare HMO | Admitting: Family Medicine

## 2017-11-27 ENCOUNTER — Encounter: Payer: Self-pay | Admitting: Family Medicine

## 2017-11-27 VITALS — BP 130/80 | HR 63 | Temp 97.7°F | Wt 166.4 lb

## 2017-11-27 DIAGNOSIS — E785 Hyperlipidemia, unspecified: Secondary | ICD-10-CM

## 2017-11-27 DIAGNOSIS — E119 Type 2 diabetes mellitus without complications: Secondary | ICD-10-CM

## 2017-11-27 DIAGNOSIS — E2839 Other primary ovarian failure: Secondary | ICD-10-CM

## 2017-11-27 DIAGNOSIS — R109 Unspecified abdominal pain: Secondary | ICD-10-CM | POA: Insufficient documentation

## 2017-11-27 DIAGNOSIS — R42 Dizziness and giddiness: Secondary | ICD-10-CM

## 2017-11-27 LAB — POCT GLYCOSYLATED HEMOGLOBIN (HGB A1C): HEMOGLOBIN A1C: 6.3

## 2017-11-27 NOTE — Patient Instructions (Signed)
Good to see you today!  Thanks for coming in.  I will order an xray bone test - they will contact you  Use tylenol arthritis for the back pain use the celcoxib only rarely  Your diabetes is dong well  Keep moving your head to prevent dizziness  I will call you if your tests are not good.  Otherwise I will send you a letter.  If you do not hear from me with in 2 weeks please call our office.

## 2017-11-27 NOTE — Assessment & Plan Note (Signed)
Improved.  Consistent with BPPV.  Will likely slowly improve.  Discussed strategies

## 2017-11-27 NOTE — Assessment & Plan Note (Signed)
Consistent with muscle strain.   I have considered and concluded the patient has a very low likelihood of having a Bone tumor; Fracture: Aortic aneurysm: Disk infection: or Pyelonephritis:.   See after visit summary

## 2017-11-27 NOTE — Progress Notes (Signed)
Called the [facility] to schedule Bone Density but patient said that she needed to check her schedule and would prefer to call back to let us know her availability.Ozella Almond, CMA

## 2017-11-27 NOTE — Progress Notes (Signed)
Subjective  Patient is presenting with the following illnesses  BACK PAIN  Back pain began slowly several  days ago. Is primarily in Left flank area.  Bothers her mostly when she turns in bed Pain is described as achy . Patient has tried nothing. Pain radiates no. History of trauma or injury: no Patient believes might be causing their pain: pulled something  Prior history of similar pain: no History of cancer: no Weak immune system:  no History of IV drug use: no History of steroid use: no  Symptoms Incontinence of bowel or bladder:  no Numbness of leg: no Fever: no Rest or Night pain: no Weight Loss:  no* Rash: no  VERTIGO Seen recently for this.  Is some better but still has brief episodes of vertigo especially when turns her head.  No falls or focal weakness or vision changes.  Meclizine is not helping.  Does not prevent her from doing her routine  ROS see HPI Smoking Status noted.    Chief Complaint noted Review of Symptoms - see HPI PMH - Smoking status noted.     Objective Vital Signs reviewed Lungs:  Normal respiratory effort, chest expands symmetrically. Lungs are clear to auscultation, no crackles or wheezes. Back - mildly tender diffusely in R flank.  No spinal tenderness.  FROM with some pain with full twist to Left. Extremities:  No cyanosis, edema, or deformity noted with good range of motion of all major joints.   No SLR Able to walk on tip toes and heels without assistance     Assessments/Plans  Dizziness and giddiness Improved.  Consistent with BPPV.  Will likely slowly improve.  Discussed strategies   Flank pain Consistent with muscle strain.   I have considered and concluded the patient has a very low likelihood of having a Bone tumor; Fracture: Aortic aneurysm: Disk infection: or Pyelonephritis:.   See after visit summary     See after visit summary for details of patient instuctions

## 2017-11-28 ENCOUNTER — Encounter: Payer: Self-pay | Admitting: Family Medicine

## 2017-11-28 LAB — LIPID PANEL
CHOLESTEROL TOTAL: 205 mg/dL — AB (ref 100–199)
Chol/HDL Ratio: 5.1 ratio — ABNORMAL HIGH (ref 0.0–4.4)
HDL: 40 mg/dL (ref >39)
LDL CALC: 105 mg/dL — AB (ref 0–99)
TRIGLYCERIDES: 301 mg/dL — AB (ref 0–149)
VLDL CHOLESTEROL CAL: 60 mg/dL — AB (ref 5–40)

## 2017-12-09 ENCOUNTER — Telehealth: Payer: Self-pay

## 2017-12-09 NOTE — Telephone Encounter (Signed)
Called patient to inform her of Bone Density appointment   -12/19/2017 @0900  with a show time of 0840.  -Patient is to bring ID and insurance card and wear 2 piece clothing with no metal -Patient is to stop taking calcium 2 days prior to appointment -Patient is to bring an interpreter with her if she needs one  After giving information to patient's husband he mentioned that she needed an xray for her back. I told him that I could not authorize that but that I would be happy to make an appointment for her to see Dr. Erin Hearing and be evaluated. No appointment was made. The converst  Patient's son got on the line and said that he needed the appointment information mailed to them because that was a lot to write down.  I mailed the appointment card and above instruction along with a map to [facility] [place].Ozella Almond, CMA

## 2017-12-09 NOTE — Telephone Encounter (Signed)
Patient husband left message on nurse line. Called back and he said she would like to have her bone density scan scheduled in the AM hours sometime this week if possible. She also is still complaining of back pain. Danley Danker, RN [facility] RN)

## 2017-12-11 ENCOUNTER — Other Ambulatory Visit: Payer: Self-pay

## 2017-12-11 MED ORDER — CELECOXIB 50 MG PO CAPS: 50.0000 mg | ORAL_CAPSULE | Freq: Two times a day (BID) | ORAL | 1 refills | Status: AC

## 2017-12-11 NOTE — Telephone Encounter (Signed)
Patient husband left message on nurse line stating patient still having back and hip pain. Requests refill on Celebrex. Danley Danker, RN [facility] RN)

## 2017-12-11 NOTE — Telephone Encounter (Signed)
Let them know I sent in  Thanks  [facility]

## 2017-12-12 NOTE — Telephone Encounter (Signed)
Called and left message that RX for Celebrex has been called into pharmacy.Ozella Almond, CMA

## 2017-12-19 ENCOUNTER — Telehealth: Payer: Self-pay | Admitting: Family Medicine

## 2017-12-19 ENCOUNTER — Ambulatory Visit
Admission: RE | Admit: 2017-12-19 | Discharge: 2017-12-19 | Disposition: A | Discharge status: Home / Self Care | Payer: Medicare HMO | Source: Ambulatory Visit | Attending: Family Medicine | Admitting: Family Medicine

## 2017-12-19 DIAGNOSIS — E2839 Other primary ovarian failure: Secondary | ICD-10-CM

## 2017-12-19 DIAGNOSIS — M85852 Other specified disorders of bone density and structure, left thigh: Secondary | ICD-10-CM | POA: Diagnosis not present

## 2017-12-19 DIAGNOSIS — R109 Unspecified abdominal pain: Secondary | ICD-10-CM

## 2017-12-19 NOTE — Telephone Encounter (Signed)
Please call patient and let them know I ordered and xray (which is a different test from the Dexa) and they can go and get this. They should make an appointment to see me next week Thanks  [place]

## 2017-12-19 NOTE — Telephone Encounter (Signed)
LMOVM informing pt and asking them to call back to schedule an appt for next week. Deseree Kennon Holter, CMA

## 2017-12-19 NOTE — Telephone Encounter (Signed)
Pt husband came in office requesting to be call by MD, pt went to [place], and the pt is still having pain on the ride side of the body. She's taking some medicine but it's not working. Please call [phone] to discuss more information. Thank you.

## 2017-12-23 ENCOUNTER — Telehealth: Payer: Self-pay | Admitting: Family Medicine

## 2017-12-23 NOTE — Telephone Encounter (Signed)
Pt son informed that I left a message Friday telling them the dr ordered an xray for her to get done, no appt needed. Evany Schecter Kennon Holter, CMA

## 2017-12-23 NOTE — Telephone Encounter (Signed)
Son called and would like to have x-ray because she is still have the back pain. She did the Bone density test last week but has not been told what the results were. Please call son with question. jw

## 2017-12-24 ENCOUNTER — Ambulatory Visit
Admission: RE | Admit: 2017-12-24 | Discharge: 2017-12-24 | Disposition: A | Discharge status: Home / Self Care | Payer: Medicare HMO | Source: Ambulatory Visit | Attending: Family Medicine | Admitting: Family Medicine

## 2017-12-24 DIAGNOSIS — M47816 Spondylosis without myelopathy or radiculopathy, lumbar region: Secondary | ICD-10-CM | POA: Diagnosis not present

## 2017-12-24 DIAGNOSIS — R109 Unspecified abdominal pain: Secondary | ICD-10-CM

## 2018-01-08 ENCOUNTER — Telehealth: Payer: Self-pay

## 2018-01-08 DIAGNOSIS — Z79899 Other long term (current) drug therapy: Secondary | ICD-10-CM | POA: Diagnosis not present

## 2018-01-08 DIAGNOSIS — R1084 Generalized abdominal pain: Secondary | ICD-10-CM | POA: Diagnosis not present

## 2018-01-08 DIAGNOSIS — E782 Mixed hyperlipidemia: Secondary | ICD-10-CM | POA: Diagnosis not present

## 2018-01-08 DIAGNOSIS — R319 Hematuria, unspecified: Secondary | ICD-10-CM | POA: Diagnosis not present

## 2018-01-08 DIAGNOSIS — I10 Essential (primary) hypertension: Secondary | ICD-10-CM | POA: Diagnosis not present

## 2018-01-08 DIAGNOSIS — D518 Other vitamin B12 deficiency anemias: Secondary | ICD-10-CM | POA: Diagnosis not present

## 2018-01-08 DIAGNOSIS — R350 Frequency of micturition: Secondary | ICD-10-CM | POA: Diagnosis not present

## 2018-01-08 DIAGNOSIS — E038 Other specified hypothyroidism: Secondary | ICD-10-CM | POA: Diagnosis not present

## 2018-01-08 DIAGNOSIS — E1165 Type 2 diabetes mellitus with hyperglycemia: Secondary | ICD-10-CM | POA: Diagnosis not present

## 2018-01-08 DIAGNOSIS — E559 Vitamin D deficiency, unspecified: Secondary | ICD-10-CM | POA: Diagnosis not present

## 2018-01-08 NOTE — Telephone Encounter (Signed)
Pt already scheduled for an appt. Nicoya Friel, CMA  

## 2018-01-08 NOTE — Telephone Encounter (Signed)
Pls call them and ask them to make an appointment to see me so I can see if they need a referral and who to send them to  Thanks  [facility]

## 2018-01-08 NOTE — Telephone Encounter (Signed)
Left message on nurse line requesting a referral to a specialist for continued back pain.  Call back number [phone] [name], RN

## 2018-01-13 DIAGNOSIS — R69 Illness, unspecified: Secondary | ICD-10-CM | POA: Diagnosis not present

## 2018-01-15 DIAGNOSIS — E1165 Type 2 diabetes mellitus with hyperglycemia: Secondary | ICD-10-CM | POA: Diagnosis not present

## 2018-01-15 DIAGNOSIS — E782 Mixed hyperlipidemia: Secondary | ICD-10-CM | POA: Diagnosis not present

## 2018-01-15 DIAGNOSIS — J4520 Mild intermittent asthma, uncomplicated: Secondary | ICD-10-CM | POA: Diagnosis not present

## 2018-01-15 DIAGNOSIS — I10 Essential (primary) hypertension: Secondary | ICD-10-CM | POA: Diagnosis not present

## 2018-01-21 ENCOUNTER — Ambulatory Visit: Payer: Medicare HMO | Admitting: Family Medicine

## 2018-02-18 DIAGNOSIS — I10 Essential (primary) hypertension: Secondary | ICD-10-CM | POA: Diagnosis not present

## 2018-02-18 DIAGNOSIS — E1165 Type 2 diabetes mellitus with hyperglycemia: Secondary | ICD-10-CM | POA: Diagnosis not present

## 2018-02-18 DIAGNOSIS — J4520 Mild intermittent asthma, uncomplicated: Secondary | ICD-10-CM | POA: Diagnosis not present

## 2018-02-18 DIAGNOSIS — E782 Mixed hyperlipidemia: Secondary | ICD-10-CM | POA: Diagnosis not present

## 2018-03-11 ENCOUNTER — Other Ambulatory Visit: Payer: Self-pay | Admitting: Family Medicine

## 2018-03-13 DIAGNOSIS — E1165 Type 2 diabetes mellitus with hyperglycemia: Secondary | ICD-10-CM | POA: Diagnosis not present

## 2018-03-13 DIAGNOSIS — I10 Essential (primary) hypertension: Secondary | ICD-10-CM | POA: Diagnosis not present

## 2018-03-13 DIAGNOSIS — E782 Mixed hyperlipidemia: Secondary | ICD-10-CM | POA: Diagnosis not present

## 2018-03-13 DIAGNOSIS — J4520 Mild intermittent asthma, uncomplicated: Secondary | ICD-10-CM | POA: Diagnosis not present

## 2018-06-04 DIAGNOSIS — J4520 Mild intermittent asthma, uncomplicated: Secondary | ICD-10-CM | POA: Diagnosis not present

## 2018-06-04 DIAGNOSIS — E1165 Type 2 diabetes mellitus with hyperglycemia: Secondary | ICD-10-CM | POA: Diagnosis not present

## 2018-06-04 DIAGNOSIS — I10 Essential (primary) hypertension: Secondary | ICD-10-CM | POA: Diagnosis not present

## 2018-06-04 DIAGNOSIS — E782 Mixed hyperlipidemia: Secondary | ICD-10-CM | POA: Diagnosis not present

## 2018-06-05 DIAGNOSIS — E1165 Type 2 diabetes mellitus with hyperglycemia: Secondary | ICD-10-CM | POA: Diagnosis not present

## 2018-06-05 DIAGNOSIS — E559 Vitamin D deficiency, unspecified: Secondary | ICD-10-CM | POA: Diagnosis not present

## 2018-06-05 DIAGNOSIS — J4520 Mild intermittent asthma, uncomplicated: Secondary | ICD-10-CM | POA: Diagnosis not present

## 2018-06-05 DIAGNOSIS — J069 Acute upper respiratory infection, unspecified: Secondary | ICD-10-CM | POA: Diagnosis not present

## 2018-06-11 DIAGNOSIS — E1165 Type 2 diabetes mellitus with hyperglycemia: Secondary | ICD-10-CM | POA: Diagnosis not present

## 2018-06-11 DIAGNOSIS — D518 Other vitamin B12 deficiency anemias: Secondary | ICD-10-CM | POA: Diagnosis not present

## 2018-06-11 DIAGNOSIS — Z79899 Other long term (current) drug therapy: Secondary | ICD-10-CM | POA: Diagnosis not present

## 2018-06-11 DIAGNOSIS — E559 Vitamin D deficiency, unspecified: Secondary | ICD-10-CM | POA: Diagnosis not present

## 2018-06-11 DIAGNOSIS — E038 Other specified hypothyroidism: Secondary | ICD-10-CM | POA: Diagnosis not present

## 2018-06-11 DIAGNOSIS — J069 Acute upper respiratory infection, unspecified: Secondary | ICD-10-CM | POA: Diagnosis not present

## 2018-06-11 DIAGNOSIS — E782 Mixed hyperlipidemia: Secondary | ICD-10-CM | POA: Diagnosis not present

## 2018-06-11 NOTE — Telephone Encounter (Signed)
Finished

## 2018-07-08 DIAGNOSIS — E782 Mixed hyperlipidemia: Secondary | ICD-10-CM | POA: Diagnosis not present

## 2018-07-08 DIAGNOSIS — J4520 Mild intermittent asthma, uncomplicated: Secondary | ICD-10-CM | POA: Diagnosis not present

## 2018-07-08 DIAGNOSIS — E1165 Type 2 diabetes mellitus with hyperglycemia: Secondary | ICD-10-CM | POA: Diagnosis not present

## 2018-07-08 DIAGNOSIS — I10 Essential (primary) hypertension: Secondary | ICD-10-CM | POA: Diagnosis not present

## 2018-08-05 DIAGNOSIS — R52 Pain, unspecified: Secondary | ICD-10-CM | POA: Diagnosis not present

## 2018-08-05 DIAGNOSIS — E785 Hyperlipidemia, unspecified: Secondary | ICD-10-CM | POA: Diagnosis not present

## 2018-08-05 DIAGNOSIS — E782 Mixed hyperlipidemia: Secondary | ICD-10-CM | POA: Diagnosis not present

## 2018-08-05 DIAGNOSIS — Z6831 Body mass index (BMI) 31.0-31.9, adult: Secondary | ICD-10-CM | POA: Diagnosis not present

## 2018-08-05 DIAGNOSIS — K5900 Constipation, unspecified: Secondary | ICD-10-CM | POA: Diagnosis not present

## 2018-08-05 DIAGNOSIS — E1165 Type 2 diabetes mellitus with hyperglycemia: Secondary | ICD-10-CM | POA: Diagnosis not present

## 2018-08-05 DIAGNOSIS — K219 Gastro-esophageal reflux disease without esophagitis: Secondary | ICD-10-CM | POA: Diagnosis not present

## 2018-08-05 DIAGNOSIS — E119 Type 2 diabetes mellitus without complications: Secondary | ICD-10-CM | POA: Diagnosis not present

## 2018-08-05 DIAGNOSIS — Z791 Long term (current) use of non-steroidal anti-inflammatories (NSAID): Secondary | ICD-10-CM | POA: Diagnosis not present

## 2018-08-05 DIAGNOSIS — I10 Essential (primary) hypertension: Secondary | ICD-10-CM | POA: Diagnosis not present

## 2018-08-05 DIAGNOSIS — J4520 Mild intermittent asthma, uncomplicated: Secondary | ICD-10-CM | POA: Diagnosis not present

## 2018-08-05 DIAGNOSIS — Z7984 Long term (current) use of oral hypoglycemic drugs: Secondary | ICD-10-CM | POA: Diagnosis not present

## 2018-08-05 DIAGNOSIS — E669 Obesity, unspecified: Secondary | ICD-10-CM | POA: Diagnosis not present

## 2018-08-26 DIAGNOSIS — E1165 Type 2 diabetes mellitus with hyperglycemia: Secondary | ICD-10-CM | POA: Diagnosis not present

## 2018-08-26 DIAGNOSIS — J0140 Acute pansinusitis, unspecified: Secondary | ICD-10-CM | POA: Diagnosis not present

## 2018-08-26 DIAGNOSIS — J4520 Mild intermittent asthma, uncomplicated: Secondary | ICD-10-CM | POA: Diagnosis not present

## 2018-08-26 DIAGNOSIS — Z23 Encounter for immunization: Secondary | ICD-10-CM | POA: Diagnosis not present

## 2018-08-26 DIAGNOSIS — I10 Essential (primary) hypertension: Secondary | ICD-10-CM | POA: Diagnosis not present

## 2018-08-26 IMAGING — CR DG LUMBAR SPINE COMPLETE 4+V
5 series · 5 of 5 positions shown · non-contrast
Comparison: None.

CLINICAL DATA: Right flank pain for 1 month without known injury.

EXAM:
LUMBAR SPINE - COMPLETE 4+ VIEW

[w lumbar spine ap]
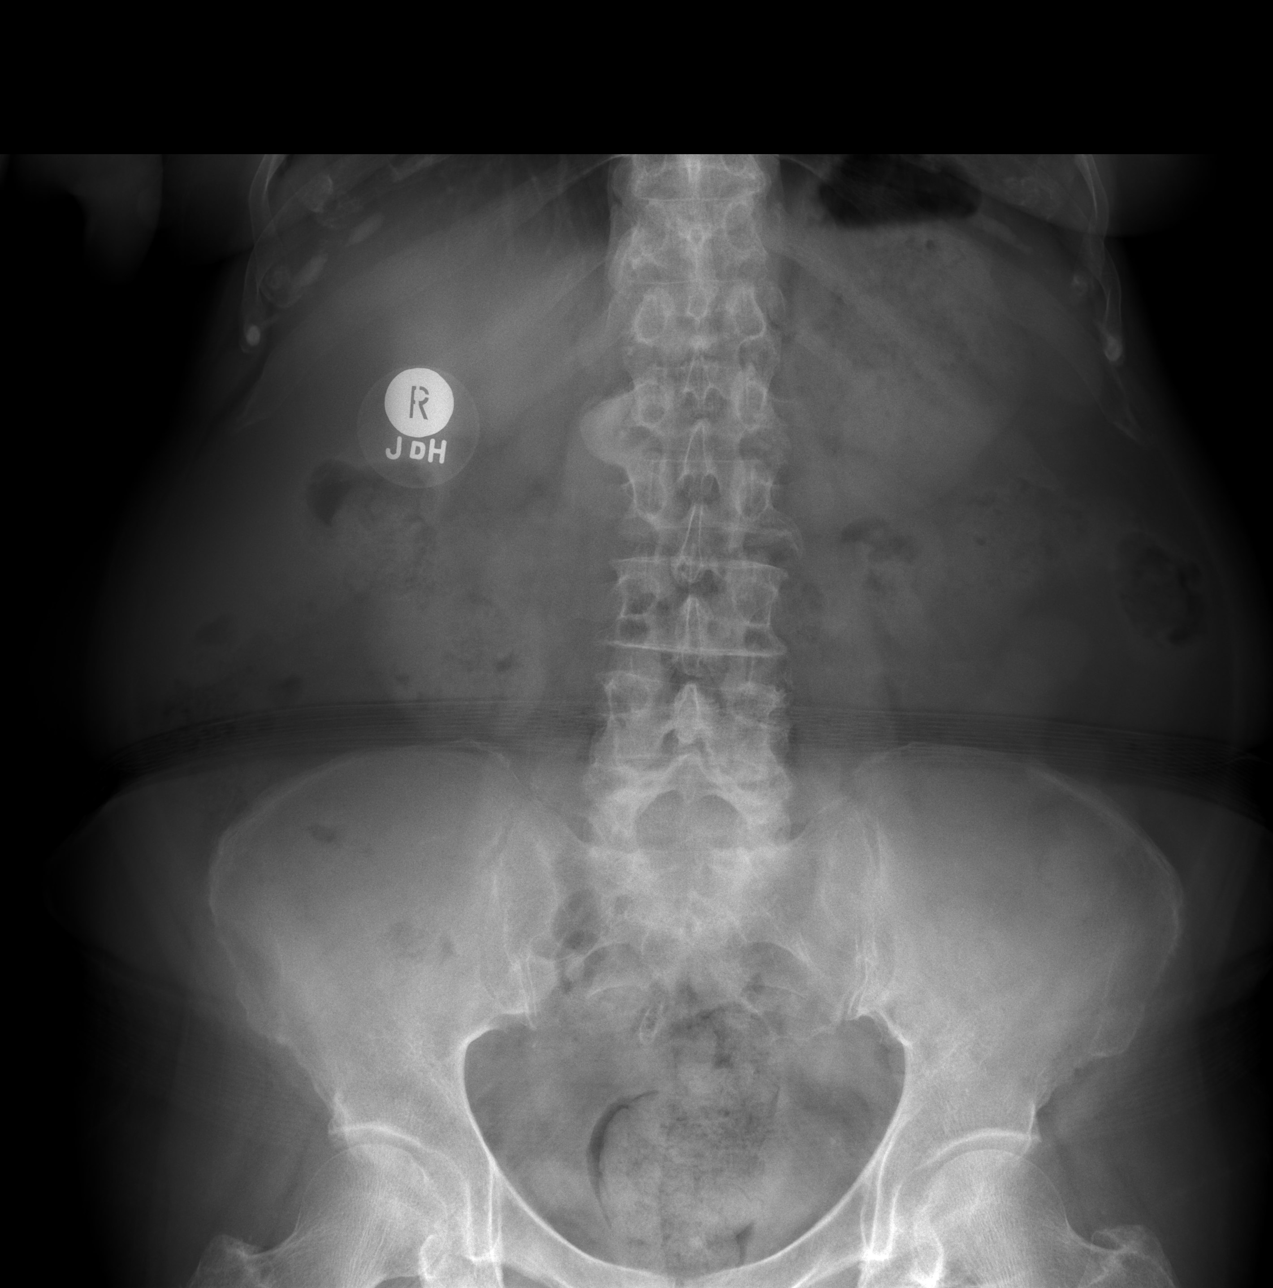

[w lumbar spine obl (1 of 2)]
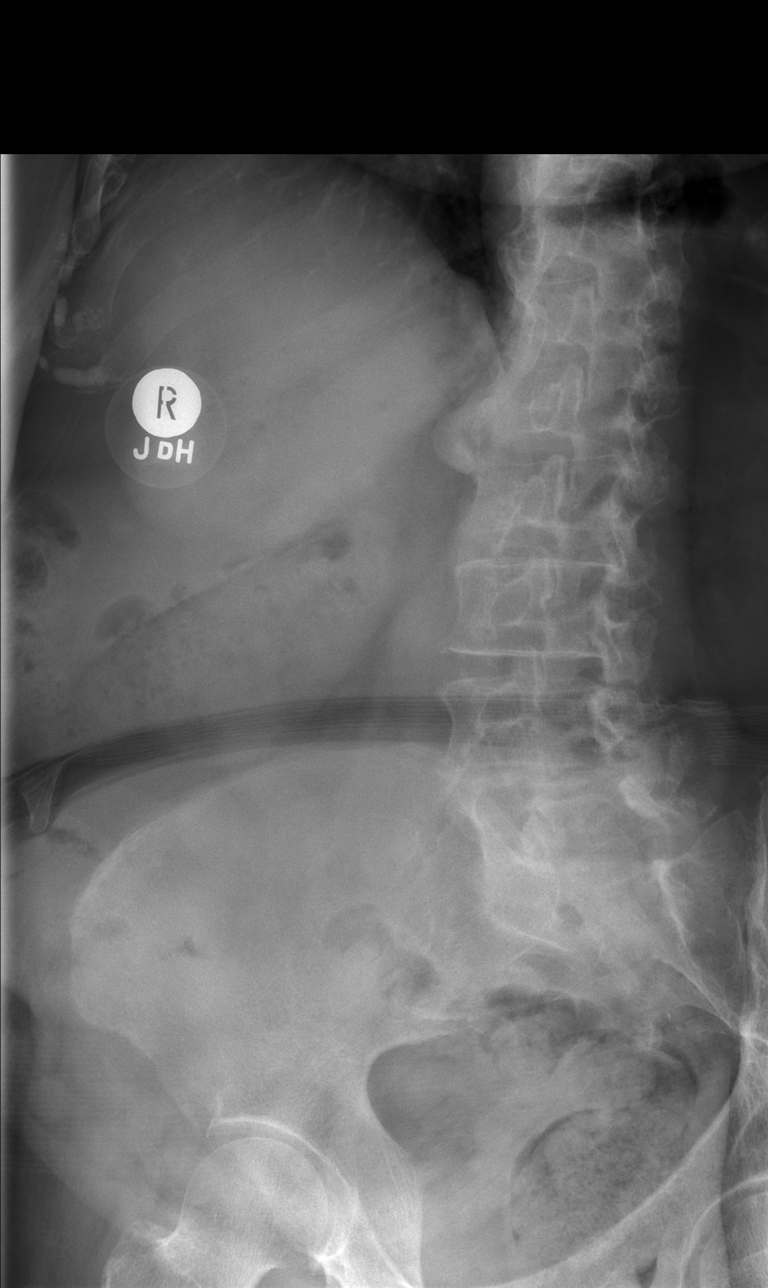

[w lumbar spine obl (2 of 2)]
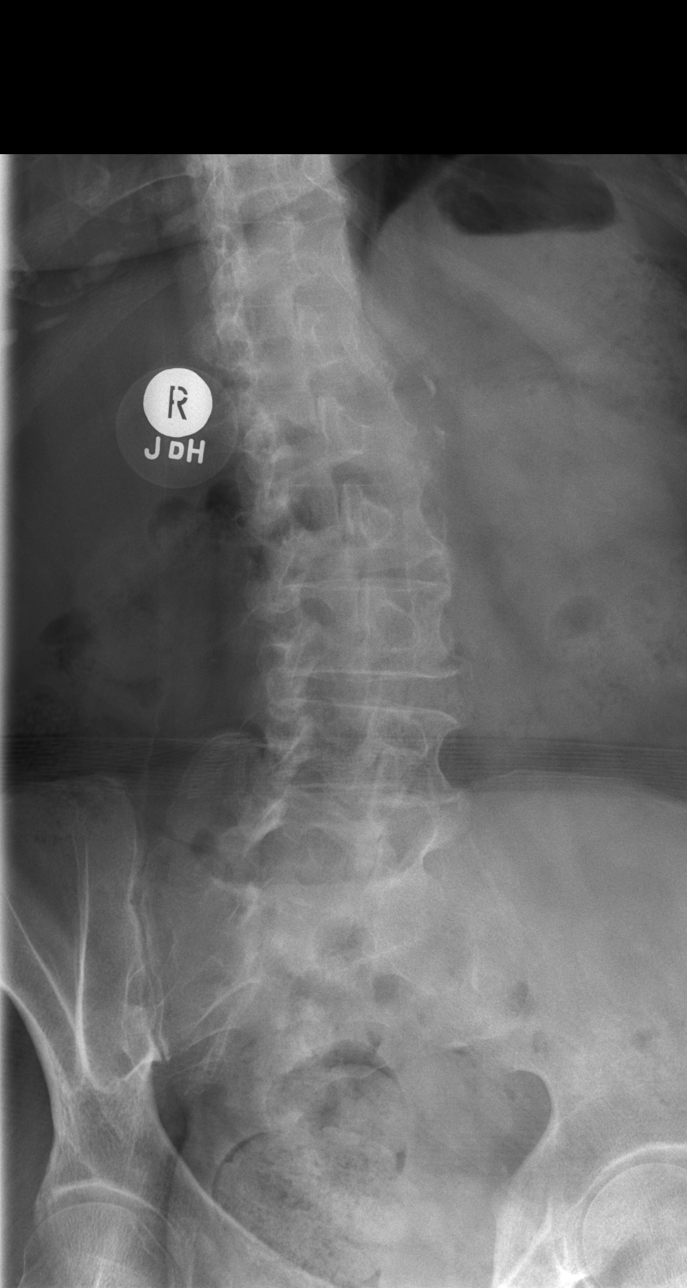

[w lumbar spine lat]
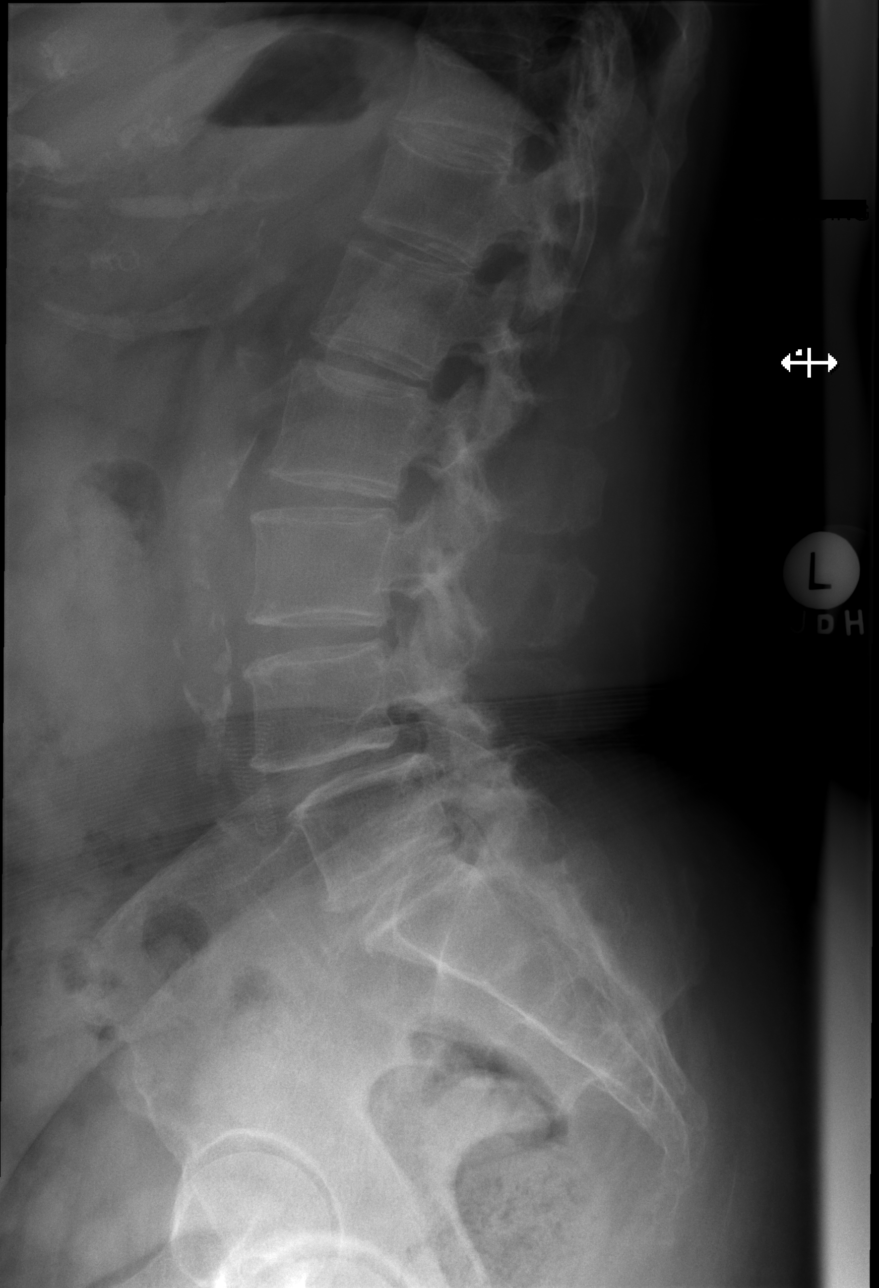

[w lumbar l-5 s-1 spot]
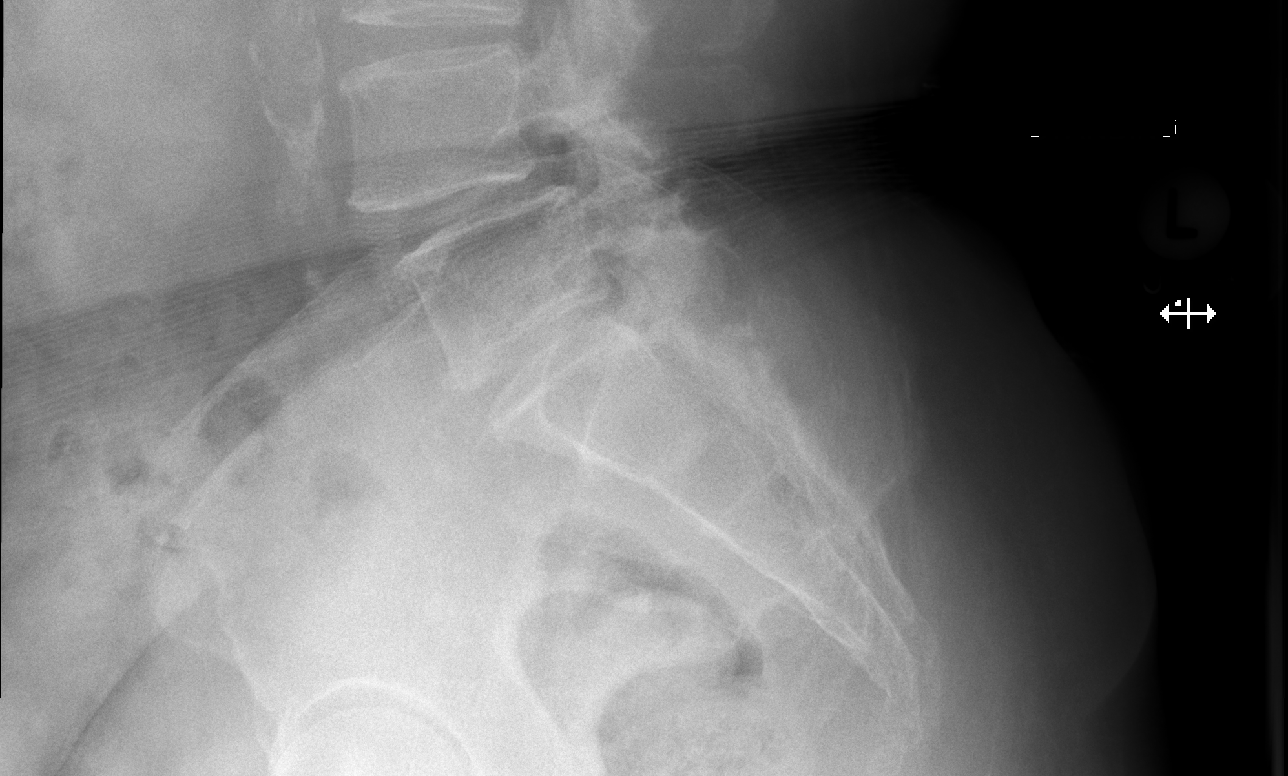

[5 of 5 positions shown; findings below may reference images not displayed]

FINDINGS: Mild grade 1 anterolisthesis of L4-5 is noted secondary to posterior
facet joint hypertrophy. No fracture is noted. Mild degenerative
disc disease is noted at L4-5. Remaining disc spaces appear intact.
Atherosclerosis of abdominal aorta is noted. Large osteophyte is
seen arising to the right of L1-2.
IMPRESSION: Mild degenerative changes as described above. No acute abnormality
seen in the lumbar spine.

Aortic Atherosclerosis (FXOVS-XPT.T).

## 2018-09-09 DIAGNOSIS — J4520 Mild intermittent asthma, uncomplicated: Secondary | ICD-10-CM | POA: Diagnosis not present

## 2018-09-09 DIAGNOSIS — I10 Essential (primary) hypertension: Secondary | ICD-10-CM | POA: Diagnosis not present

## 2018-09-09 DIAGNOSIS — E782 Mixed hyperlipidemia: Secondary | ICD-10-CM | POA: Diagnosis not present

## 2018-09-09 DIAGNOSIS — E1165 Type 2 diabetes mellitus with hyperglycemia: Secondary | ICD-10-CM | POA: Diagnosis not present

## 2018-11-13 DIAGNOSIS — J4520 Mild intermittent asthma, uncomplicated: Secondary | ICD-10-CM | POA: Diagnosis not present

## 2018-11-13 DIAGNOSIS — E1165 Type 2 diabetes mellitus with hyperglycemia: Secondary | ICD-10-CM | POA: Diagnosis not present

## 2018-11-13 DIAGNOSIS — I10 Essential (primary) hypertension: Secondary | ICD-10-CM | POA: Diagnosis not present

## 2018-11-13 DIAGNOSIS — E782 Mixed hyperlipidemia: Secondary | ICD-10-CM | POA: Diagnosis not present

## 2018-12-15 DIAGNOSIS — E1165 Type 2 diabetes mellitus with hyperglycemia: Secondary | ICD-10-CM | POA: Diagnosis not present

## 2018-12-15 DIAGNOSIS — E782 Mixed hyperlipidemia: Secondary | ICD-10-CM | POA: Diagnosis not present

## 2018-12-15 DIAGNOSIS — I10 Essential (primary) hypertension: Secondary | ICD-10-CM | POA: Diagnosis not present

## 2018-12-15 DIAGNOSIS — J4520 Mild intermittent asthma, uncomplicated: Secondary | ICD-10-CM | POA: Diagnosis not present

## 2019-01-02 DIAGNOSIS — E782 Mixed hyperlipidemia: Secondary | ICD-10-CM | POA: Diagnosis not present

## 2019-01-02 DIAGNOSIS — I10 Essential (primary) hypertension: Secondary | ICD-10-CM | POA: Diagnosis not present

## 2019-01-02 DIAGNOSIS — E1165 Type 2 diabetes mellitus with hyperglycemia: Secondary | ICD-10-CM | POA: Diagnosis not present

## 2019-01-02 DIAGNOSIS — J4520 Mild intermittent asthma, uncomplicated: Secondary | ICD-10-CM | POA: Diagnosis not present

## 2019-02-10 DIAGNOSIS — J4520 Mild intermittent asthma, uncomplicated: Secondary | ICD-10-CM | POA: Diagnosis not present

## 2019-02-10 DIAGNOSIS — I10 Essential (primary) hypertension: Secondary | ICD-10-CM | POA: Diagnosis not present

## 2019-02-10 DIAGNOSIS — E1165 Type 2 diabetes mellitus with hyperglycemia: Secondary | ICD-10-CM | POA: Diagnosis not present

## 2019-02-10 DIAGNOSIS — E782 Mixed hyperlipidemia: Secondary | ICD-10-CM | POA: Diagnosis not present

## 2019-02-25 DIAGNOSIS — R011 Cardiac murmur, unspecified: Secondary | ICD-10-CM | POA: Diagnosis not present

## 2019-02-25 DIAGNOSIS — Z0000 Encounter for general adult medical examination without abnormal findings: Secondary | ICD-10-CM | POA: Diagnosis not present

## 2019-02-25 DIAGNOSIS — E1165 Type 2 diabetes mellitus with hyperglycemia: Secondary | ICD-10-CM | POA: Diagnosis not present

## 2019-02-25 DIAGNOSIS — D518 Other vitamin B12 deficiency anemias: Secondary | ICD-10-CM | POA: Diagnosis not present

## 2019-02-25 DIAGNOSIS — E559 Vitamin D deficiency, unspecified: Secondary | ICD-10-CM | POA: Diagnosis not present

## 2019-02-25 DIAGNOSIS — I10 Essential (primary) hypertension: Secondary | ICD-10-CM | POA: Diagnosis not present

## 2019-02-25 DIAGNOSIS — E038 Other specified hypothyroidism: Secondary | ICD-10-CM | POA: Diagnosis not present

## 2019-02-25 DIAGNOSIS — R0602 Shortness of breath: Secondary | ICD-10-CM | POA: Diagnosis not present

## 2019-02-25 DIAGNOSIS — K219 Gastro-esophageal reflux disease without esophagitis: Secondary | ICD-10-CM | POA: Diagnosis not present

## 2019-02-25 DIAGNOSIS — Z79899 Other long term (current) drug therapy: Secondary | ICD-10-CM | POA: Diagnosis not present

## 2019-02-25 DIAGNOSIS — E782 Mixed hyperlipidemia: Secondary | ICD-10-CM | POA: Diagnosis not present

## 2019-02-27 DIAGNOSIS — R590 Localized enlarged lymph nodes: Secondary | ICD-10-CM | POA: Diagnosis not present

## 2019-02-27 DIAGNOSIS — R0989 Other specified symptoms and signs involving the circulatory and respiratory systems: Secondary | ICD-10-CM | POA: Diagnosis not present

## 2019-02-27 DIAGNOSIS — R69 Illness, unspecified: Secondary | ICD-10-CM | POA: Diagnosis not present

## 2019-03-02 DIAGNOSIS — D492 Neoplasm of unspecified behavior of bone, soft tissue, and skin: Secondary | ICD-10-CM | POA: Diagnosis not present

## 2019-03-02 DIAGNOSIS — R221 Localized swelling, mass and lump, neck: Secondary | ICD-10-CM | POA: Diagnosis not present

## 2019-03-06 DIAGNOSIS — E1165 Type 2 diabetes mellitus with hyperglycemia: Secondary | ICD-10-CM | POA: Diagnosis not present

## 2019-03-06 DIAGNOSIS — I10 Essential (primary) hypertension: Secondary | ICD-10-CM | POA: Diagnosis not present

## 2019-03-06 DIAGNOSIS — J4520 Mild intermittent asthma, uncomplicated: Secondary | ICD-10-CM | POA: Diagnosis not present

## 2019-03-06 DIAGNOSIS — E782 Mixed hyperlipidemia: Secondary | ICD-10-CM | POA: Diagnosis not present

## 2019-03-24 DIAGNOSIS — L659 Nonscarring hair loss, unspecified: Secondary | ICD-10-CM | POA: Diagnosis not present

## 2019-03-24 DIAGNOSIS — L723 Sebaceous cyst: Secondary | ICD-10-CM | POA: Diagnosis not present

## 2019-04-18 DIAGNOSIS — R69 Illness, unspecified: Secondary | ICD-10-CM | POA: Diagnosis not present

## 2019-04-20 DIAGNOSIS — I10 Essential (primary) hypertension: Secondary | ICD-10-CM | POA: Diagnosis not present

## 2019-04-20 DIAGNOSIS — J4520 Mild intermittent asthma, uncomplicated: Secondary | ICD-10-CM | POA: Diagnosis not present

## 2019-04-20 DIAGNOSIS — E1165 Type 2 diabetes mellitus with hyperglycemia: Secondary | ICD-10-CM | POA: Diagnosis not present

## 2019-04-20 DIAGNOSIS — E782 Mixed hyperlipidemia: Secondary | ICD-10-CM | POA: Diagnosis not present

## 2019-04-30 DIAGNOSIS — E1165 Type 2 diabetes mellitus with hyperglycemia: Secondary | ICD-10-CM | POA: Diagnosis not present

## 2019-04-30 DIAGNOSIS — J4520 Mild intermittent asthma, uncomplicated: Secondary | ICD-10-CM | POA: Diagnosis not present

## 2019-04-30 DIAGNOSIS — I10 Essential (primary) hypertension: Secondary | ICD-10-CM | POA: Diagnosis not present

## 2019-04-30 DIAGNOSIS — E782 Mixed hyperlipidemia: Secondary | ICD-10-CM | POA: Diagnosis not present

## 2019-06-03 DIAGNOSIS — Z79899 Other long term (current) drug therapy: Secondary | ICD-10-CM | POA: Diagnosis not present

## 2019-06-03 DIAGNOSIS — I10 Essential (primary) hypertension: Secondary | ICD-10-CM | POA: Diagnosis not present

## 2019-06-03 DIAGNOSIS — E038 Other specified hypothyroidism: Secondary | ICD-10-CM | POA: Diagnosis not present

## 2019-06-03 DIAGNOSIS — E1165 Type 2 diabetes mellitus with hyperglycemia: Secondary | ICD-10-CM | POA: Diagnosis not present

## 2019-06-03 DIAGNOSIS — K219 Gastro-esophageal reflux disease without esophagitis: Secondary | ICD-10-CM | POA: Diagnosis not present

## 2019-06-03 DIAGNOSIS — D518 Other vitamin B12 deficiency anemias: Secondary | ICD-10-CM | POA: Diagnosis not present

## 2019-06-03 DIAGNOSIS — R300 Dysuria: Secondary | ICD-10-CM | POA: Diagnosis not present

## 2019-06-03 DIAGNOSIS — E782 Mixed hyperlipidemia: Secondary | ICD-10-CM | POA: Diagnosis not present

## 2019-06-03 DIAGNOSIS — E559 Vitamin D deficiency, unspecified: Secondary | ICD-10-CM | POA: Diagnosis not present

## 2019-06-03 DIAGNOSIS — R69 Illness, unspecified: Secondary | ICD-10-CM | POA: Diagnosis not present

## 2019-06-04 DIAGNOSIS — D518 Other vitamin B12 deficiency anemias: Secondary | ICD-10-CM | POA: Diagnosis not present

## 2019-06-04 DIAGNOSIS — E782 Mixed hyperlipidemia: Secondary | ICD-10-CM | POA: Diagnosis not present

## 2019-06-04 DIAGNOSIS — I10 Essential (primary) hypertension: Secondary | ICD-10-CM | POA: Diagnosis not present

## 2019-06-04 DIAGNOSIS — J4520 Mild intermittent asthma, uncomplicated: Secondary | ICD-10-CM | POA: Diagnosis not present

## 2019-06-04 DIAGNOSIS — E038 Other specified hypothyroidism: Secondary | ICD-10-CM | POA: Diagnosis not present

## 2019-06-04 DIAGNOSIS — E1165 Type 2 diabetes mellitus with hyperglycemia: Secondary | ICD-10-CM | POA: Diagnosis not present

## 2019-06-26 DIAGNOSIS — R69 Illness, unspecified: Secondary | ICD-10-CM | POA: Diagnosis not present

## 2019-07-07 DIAGNOSIS — E1165 Type 2 diabetes mellitus with hyperglycemia: Secondary | ICD-10-CM | POA: Diagnosis not present

## 2019-07-07 DIAGNOSIS — I10 Essential (primary) hypertension: Secondary | ICD-10-CM | POA: Diagnosis not present

## 2019-07-07 DIAGNOSIS — E782 Mixed hyperlipidemia: Secondary | ICD-10-CM | POA: Diagnosis not present

## 2019-07-07 DIAGNOSIS — J4520 Mild intermittent asthma, uncomplicated: Secondary | ICD-10-CM | POA: Diagnosis not present

## 2019-07-28 ENCOUNTER — Ambulatory Visit: Payer: Medicare HMO | Admitting: Family Medicine

## 2019-07-29 ENCOUNTER — Other Ambulatory Visit: Payer: Self-pay

## 2019-07-29 ENCOUNTER — Ambulatory Visit (INDEPENDENT_AMBULATORY_CARE_PROVIDER_SITE_OTHER): Payer: Medicare HMO | Admitting: Family Medicine

## 2019-07-29 ENCOUNTER — Encounter: Payer: Self-pay | Admitting: Family Medicine

## 2019-07-29 VITALS — BP 160/88 | HR 83 | Wt 171.2 lb

## 2019-07-29 DIAGNOSIS — D500 Iron deficiency anemia secondary to blood loss (chronic): Secondary | ICD-10-CM | POA: Diagnosis not present

## 2019-07-29 DIAGNOSIS — E559 Vitamin D deficiency, unspecified: Secondary | ICD-10-CM

## 2019-07-29 DIAGNOSIS — E781 Pure hyperglyceridemia: Secondary | ICD-10-CM | POA: Diagnosis not present

## 2019-07-29 DIAGNOSIS — I10 Essential (primary) hypertension: Secondary | ICD-10-CM | POA: Diagnosis not present

## 2019-07-29 DIAGNOSIS — Z23 Encounter for immunization: Secondary | ICD-10-CM | POA: Diagnosis not present

## 2019-07-29 DIAGNOSIS — E039 Hypothyroidism, unspecified: Secondary | ICD-10-CM

## 2019-07-29 DIAGNOSIS — E119 Type 2 diabetes mellitus without complications: Secondary | ICD-10-CM | POA: Diagnosis not present

## 2019-07-29 LAB — POCT GLYCOSYLATED HEMOGLOBIN (HGB A1C): HbA1c, POC (controlled diabetic range): 6 % (ref 0.0–7.0)

## 2019-07-29 MED ORDER — LOSARTAN POTASSIUM 25 MG PO TABS: 25.0000 mg | ORAL_TABLET | Freq: Every day | ORAL | 1 refills | Status: AC

## 2019-07-29 NOTE — Assessment & Plan Note (Signed)
Check labs 

## 2019-07-29 NOTE — Progress Notes (Signed)
Subjective  Jacqueline Hart is a 73 y.o. female is presenting with the following  HYPERTENSION Disease Monitoring: Blood pressure range-not checking Chest pain, palpitations- no      Dyspnea- mild with exercise Medications: Compliance- brings in her list Lightheadedness,Syncope- no   Edema- no  DIABETES Disease Monitoring: Blood Sugar ranges-not checking Polyuria/phagia/dipsia- no      Visual problems- no Medications: Compliance- metformin bid Hypoglycemic symptoms- no  HYPERLIPIDEMIA Disease Monitoring: See symptoms for Hypertension Medications: Compliance- daily pravastatin Right upper quadrant pain- no  Muscle aches- no   Chief Complaint noted Review of Symptoms - see HPI PMH - Smoking status noted.    Objective Vital Signs reviewed BP (!) 160/88   Pulse 83   Wt 171 lb 3.2 oz (77.7 kg)   SpO2 98%   BMI 33.44 kg/m  Heart - Regular rate and rhythm.  No murmurs, gallops or rubs.    Lungs:  Normal respiratory effort, chest expands symmetrically. Lungs are clear to auscultation, no crackles or wheezes. Extremities:  No cyanosis, edema, or deformity noted with good range of motion of all major joints.   Neck:  No deformities, thyromegaly, masses, or tenderness noted.   Supple with full range of motion without pain. Diabetic Foot Check -  Appearance - no lesions, ulcers or calluses Skin - no unusual pallor or redness Monofilament testing -  Right - Great toe, medial, central, lateral ball and posterior foot intact Left - Great toe, medial, central, lateral ball and posterior foot intact  Assessments/Plans  Hypothyroidism Check labs.  Not on replacement  Hypertriglyceridemia Stable Check labs   ESSENTIAL HYPERTENSION, BENIGN Not at goal.  Add low dose losartan.  Check labs   Diabetes mellitus, type 2 Well controlled  Iron deficiency anemia Check labs.  Not taking iron   Vitamin D deficiency Check labs    See after visit summary for details of patient  instructions

## 2019-07-29 NOTE — Assessment & Plan Note (Signed)
Check labs.  Not on replacement

## 2019-07-29 NOTE — Assessment & Plan Note (Signed)
Stable Check labs 

## 2019-07-29 NOTE — Assessment & Plan Note (Signed)
Well controlled 

## 2019-07-29 NOTE — Assessment & Plan Note (Signed)
Check labs.  Not taking iron

## 2019-07-29 NOTE — Patient Instructions (Signed)
Good to see you today!  Thanks for coming in.  See eye doctor for an exam  Get a mammogram in the next month  New blood pressure medication - losartan take once a day  I will call you if your tests are not good.  Otherwise I will send you a letter.  If you do not hear from me with in 2 weeks please call our office.     Come back in one month to check your blood pressure

## 2019-07-29 NOTE — Assessment & Plan Note (Signed)
Not at goal.  Add low dose losartan.  Check labs

## 2019-07-30 LAB — CBC
Hematocrit: 40.6 % (ref 34.0–46.6)
Hemoglobin: 13.5 g/dL (ref 11.1–15.9)
MCH: 30.2 pg (ref 26.6–33.0)
MCHC: 33.3 g/dL (ref 31.5–35.7)
MCV: 91 fL (ref 79–97)
Platelets: 302 10*3/uL (ref 150–450)
RBC: 4.47 x10E6/uL (ref 3.77–5.28)
RDW: 12 % (ref 11.7–15.4)
WBC: 8.4 10*3/uL (ref 3.4–10.8)

## 2019-07-30 LAB — LIPID PANEL
Chol/HDL Ratio: 3.9 ratio (ref 0.0–4.4)
Cholesterol, Total: 179 mg/dL (ref 100–199)
HDL: 46 mg/dL (ref >39)
LDL Chol Calc (NIH): 89 mg/dL (ref 0–99)
Triglycerides: 263 mg/dL — ABNORMAL HIGH (ref 0–149)
VLDL Cholesterol Cal: 44 mg/dL — ABNORMAL HIGH (ref 5–40)

## 2019-07-30 LAB — CMP14+EGFR
ALT: 20 IU/L (ref 0–32)
AST: 30 IU/L (ref 0–40)
Albumin/Globulin Ratio: 1.4 (ref 1.2–2.2)
Albumin: 4.2 g/dL (ref 3.7–4.7)
Alkaline Phosphatase: 91 IU/L (ref 39–117)
BUN/Creatinine Ratio: 10 — ABNORMAL LOW (ref 12–28)
BUN: 8 mg/dL (ref 8–27)
Bilirubin Total: 0.8 mg/dL (ref 0.0–1.2)
CO2: 26 mmol/L (ref 20–29)
Calcium: 9.7 mg/dL (ref 8.7–10.3)
Chloride: 103 mmol/L (ref 96–106)
Creatinine, Ser: 0.79 mg/dL (ref 0.57–1.00)
GFR calc Af Amer: 85 mL/min/{1.73_m2} (ref >59)
GFR calc non Af Amer: 74 mL/min/{1.73_m2} (ref >59)
Globulin, Total: 2.9 g/dL (ref 1.5–4.5)
Glucose: 108 mg/dL — ABNORMAL HIGH (ref 65–99)
Potassium: 4.8 mmol/L (ref 3.5–5.2)
Sodium: 144 mmol/L (ref 134–144)
Total Protein: 7.1 g/dL (ref 6.0–8.5)

## 2019-07-30 LAB — TSH: TSH: 5.78 u[IU]/mL — ABNORMAL HIGH (ref 0.450–4.500)

## 2019-07-30 LAB — VITAMIN D 25 HYDROXY (VIT D DEFICIENCY, FRACTURES): Vit D, 25-Hydroxy: 25.1 ng/mL — ABNORMAL LOW (ref 30.0–100.0)

## 2019-07-30 NOTE — Addendum Note (Signed)
Addended by: Talbert Cage L on: 07/30/2019 11:26 AM   Modules accepted: Orders

## 2019-07-31 ENCOUNTER — Encounter: Payer: Self-pay | Admitting: Family Medicine

## 2019-07-31 LAB — T4, FREE: Free T4: 0.98 ng/dL (ref 0.82–1.77)

## 2019-08-05 DIAGNOSIS — E782 Mixed hyperlipidemia: Secondary | ICD-10-CM | POA: Diagnosis not present

## 2019-08-05 DIAGNOSIS — E1165 Type 2 diabetes mellitus with hyperglycemia: Secondary | ICD-10-CM | POA: Diagnosis not present

## 2019-08-05 DIAGNOSIS — J4520 Mild intermittent asthma, uncomplicated: Secondary | ICD-10-CM | POA: Diagnosis not present

## 2019-08-05 DIAGNOSIS — I10 Essential (primary) hypertension: Secondary | ICD-10-CM | POA: Diagnosis not present

## 2019-09-02 ENCOUNTER — Ambulatory Visit (INDEPENDENT_AMBULATORY_CARE_PROVIDER_SITE_OTHER): Payer: Medicare HMO | Admitting: Family Medicine

## 2019-09-02 ENCOUNTER — Other Ambulatory Visit: Payer: Self-pay

## 2019-09-02 ENCOUNTER — Encounter: Payer: Self-pay | Admitting: Family Medicine

## 2019-09-02 DIAGNOSIS — E785 Hyperlipidemia, unspecified: Secondary | ICD-10-CM

## 2019-09-02 DIAGNOSIS — I10 Essential (primary) hypertension: Secondary | ICD-10-CM | POA: Diagnosis not present

## 2019-09-02 MED ORDER — ATORVASTATIN CALCIUM 40 MG PO TABS: 40.0000 mg | ORAL_TABLET | Freq: Every day | ORAL | 3 refills | Status: AC

## 2019-09-02 NOTE — Assessment & Plan Note (Signed)
BP Readings from Last 3 Encounters:  09/02/19 136/76  07/29/19 (!) 160/88  11/27/17 130/80   Improved.  Continue current medications

## 2019-09-02 NOTE — Progress Notes (Signed)
Subjective  Jacqueline Hart is a 73 y.o. female is presenting with the following HYPERTENSION Disease Monitoring Home BP Monitoring (Severity) not checking Symptoms - Chest pain- no    Dyspnea- no Medications   Compliance-  Taking all medications including new losartan. Lightheadedness-  no  Edema- no Timing - continuous  Duration - years  HYPERLIPIDEMIA Symptoms Chest pain on exertion:  no   Leg claudication:   no Medications  Compliance- taking pravastatin daily Right upper quadrant pain- no  Muscle aches- no Duration - years   Timing - continuous  Chief Complaint noted Review of Symptoms - see HPI PMH - Smoking status noted.    Objective Vital Signs reviewed BP 136/76   Pulse 81   Wt 168 lb 9.6 oz (76.5 kg)   SpO2 99%   BMI 32.93 kg/m  Heart - Regular rate and rhythm.  No murmurs, gallops or rubs.    Lungs:  Normal respiratory effort, chest expands symmetrically. Lungs are clear to auscultation, no crackles or wheezes. Extremities:  No cyanosis, edema, or deformity noted with good range of motion of all major joints.   BP 136/76 on recheck   Assessments/Plans  ESSENTIAL HYPERTENSION, BENIGN BP Readings from Last 3 Encounters:  09/02/19 136/76  07/29/19 (!) 160/88  11/27/17 130/80   Improved.  Continue current medications   Hyperlipidemia Change from pravastatin to atorvastatin for more ldl lowering    See after visit summary for details of patient instructions

## 2019-09-02 NOTE — Assessment & Plan Note (Signed)
Change from pravastatin to atorvastatin for more ldl lowering

## 2019-09-02 NOTE — Patient Instructions (Signed)
Good to see you today!  Thanks for coming in.  Keep taking the new blood pressure medicine losartan as you are  Stop the pravastatin and start atorvastatin once a day   Come back in 3 months to check your A1c again

## 2019-09-04 DIAGNOSIS — I10 Essential (primary) hypertension: Secondary | ICD-10-CM | POA: Diagnosis not present

## 2019-09-04 DIAGNOSIS — E1165 Type 2 diabetes mellitus with hyperglycemia: Secondary | ICD-10-CM | POA: Diagnosis not present

## 2019-09-04 DIAGNOSIS — J4520 Mild intermittent asthma, uncomplicated: Secondary | ICD-10-CM | POA: Diagnosis not present

## 2019-09-04 DIAGNOSIS — E782 Mixed hyperlipidemia: Secondary | ICD-10-CM | POA: Diagnosis not present

## 2019-10-01 DIAGNOSIS — I10 Essential (primary) hypertension: Secondary | ICD-10-CM | POA: Diagnosis not present

## 2019-10-01 DIAGNOSIS — E1165 Type 2 diabetes mellitus with hyperglycemia: Secondary | ICD-10-CM | POA: Diagnosis not present

## 2019-10-01 DIAGNOSIS — E782 Mixed hyperlipidemia: Secondary | ICD-10-CM | POA: Diagnosis not present

## 2019-10-01 DIAGNOSIS — J4520 Mild intermittent asthma, uncomplicated: Secondary | ICD-10-CM | POA: Diagnosis not present

## 2019-11-10 DIAGNOSIS — H53032 Strabismic amblyopia, left eye: Secondary | ICD-10-CM | POA: Diagnosis not present

## 2019-11-10 DIAGNOSIS — H52223 Regular astigmatism, bilateral: Secondary | ICD-10-CM | POA: Diagnosis not present

## 2019-11-10 DIAGNOSIS — H524 Presbyopia: Secondary | ICD-10-CM | POA: Diagnosis not present

## 2019-11-16 DIAGNOSIS — E1165 Type 2 diabetes mellitus with hyperglycemia: Secondary | ICD-10-CM | POA: Diagnosis not present

## 2019-11-16 DIAGNOSIS — J4520 Mild intermittent asthma, uncomplicated: Secondary | ICD-10-CM | POA: Diagnosis not present

## 2019-11-16 DIAGNOSIS — E782 Mixed hyperlipidemia: Secondary | ICD-10-CM | POA: Diagnosis not present

## 2019-11-16 DIAGNOSIS — I10 Essential (primary) hypertension: Secondary | ICD-10-CM | POA: Diagnosis not present

## 2019-12-02 DIAGNOSIS — E1165 Type 2 diabetes mellitus with hyperglycemia: Secondary | ICD-10-CM | POA: Diagnosis not present

## 2019-12-02 DIAGNOSIS — I10 Essential (primary) hypertension: Secondary | ICD-10-CM | POA: Diagnosis not present

## 2019-12-02 DIAGNOSIS — D518 Other vitamin B12 deficiency anemias: Secondary | ICD-10-CM | POA: Diagnosis not present

## 2019-12-02 DIAGNOSIS — J4520 Mild intermittent asthma, uncomplicated: Secondary | ICD-10-CM | POA: Diagnosis not present

## 2019-12-02 DIAGNOSIS — I2510 Atherosclerotic heart disease of native coronary artery without angina pectoris: Secondary | ICD-10-CM | POA: Diagnosis not present

## 2019-12-02 DIAGNOSIS — E782 Mixed hyperlipidemia: Secondary | ICD-10-CM | POA: Diagnosis not present

## 2019-12-02 DIAGNOSIS — E559 Vitamin D deficiency, unspecified: Secondary | ICD-10-CM | POA: Diagnosis not present

## 2019-12-02 DIAGNOSIS — Z79899 Other long term (current) drug therapy: Secondary | ICD-10-CM | POA: Diagnosis not present

## 2019-12-04 DIAGNOSIS — M75112 Incomplete rotator cuff tear or rupture of left shoulder, not specified as traumatic: Secondary | ICD-10-CM | POA: Diagnosis not present

## 2019-12-04 DIAGNOSIS — S46012A Strain of muscle(s) and tendon(s) of the rotator cuff of left shoulder, initial encounter: Secondary | ICD-10-CM | POA: Diagnosis not present

## 2019-12-04 DIAGNOSIS — M25512 Pain in left shoulder: Secondary | ICD-10-CM | POA: Diagnosis not present

## 2019-12-14 DIAGNOSIS — E782 Mixed hyperlipidemia: Secondary | ICD-10-CM | POA: Diagnosis not present

## 2019-12-14 DIAGNOSIS — E1165 Type 2 diabetes mellitus with hyperglycemia: Secondary | ICD-10-CM | POA: Diagnosis not present

## 2019-12-14 DIAGNOSIS — J4520 Mild intermittent asthma, uncomplicated: Secondary | ICD-10-CM | POA: Diagnosis not present

## 2019-12-14 DIAGNOSIS — I10 Essential (primary) hypertension: Secondary | ICD-10-CM | POA: Diagnosis not present

## 2020-01-07 DIAGNOSIS — E782 Mixed hyperlipidemia: Secondary | ICD-10-CM | POA: Diagnosis not present

## 2020-01-07 DIAGNOSIS — E1165 Type 2 diabetes mellitus with hyperglycemia: Secondary | ICD-10-CM | POA: Diagnosis not present

## 2020-01-07 DIAGNOSIS — I10 Essential (primary) hypertension: Secondary | ICD-10-CM | POA: Diagnosis not present

## 2020-01-07 DIAGNOSIS — J4520 Mild intermittent asthma, uncomplicated: Secondary | ICD-10-CM | POA: Diagnosis not present

## 2020-02-04 ENCOUNTER — Other Ambulatory Visit: Payer: Self-pay | Admitting: Internal Medicine

## 2020-02-04 DIAGNOSIS — Z1231 Encounter for screening mammogram for malignant neoplasm of breast: Secondary | ICD-10-CM

## 2020-02-08 DIAGNOSIS — I10 Essential (primary) hypertension: Secondary | ICD-10-CM | POA: Diagnosis not present

## 2020-02-08 DIAGNOSIS — E1165 Type 2 diabetes mellitus with hyperglycemia: Secondary | ICD-10-CM | POA: Diagnosis not present

## 2020-02-08 DIAGNOSIS — J4520 Mild intermittent asthma, uncomplicated: Secondary | ICD-10-CM | POA: Diagnosis not present

## 2020-02-08 DIAGNOSIS — E782 Mixed hyperlipidemia: Secondary | ICD-10-CM | POA: Diagnosis not present

## 2020-02-22 ENCOUNTER — Other Ambulatory Visit: Payer: Self-pay

## 2020-02-22 ENCOUNTER — Ambulatory Visit
Admission: RE | Admit: 2020-02-22 | Discharge: 2020-02-22 | Disposition: A | Discharge status: Home / Self Care | Payer: Medicare HMO | Source: Ambulatory Visit | Attending: Internal Medicine | Admitting: Internal Medicine

## 2020-02-22 DIAGNOSIS — Z1231 Encounter for screening mammogram for malignant neoplasm of breast: Secondary | ICD-10-CM | POA: Diagnosis not present

## 2020-03-10 DIAGNOSIS — E782 Mixed hyperlipidemia: Secondary | ICD-10-CM | POA: Diagnosis not present

## 2020-03-10 DIAGNOSIS — I10 Essential (primary) hypertension: Secondary | ICD-10-CM | POA: Diagnosis not present

## 2020-03-10 DIAGNOSIS — J4520 Mild intermittent asthma, uncomplicated: Secondary | ICD-10-CM | POA: Diagnosis not present

## 2020-03-10 DIAGNOSIS — E1165 Type 2 diabetes mellitus with hyperglycemia: Secondary | ICD-10-CM | POA: Diagnosis not present

## 2020-03-23 DIAGNOSIS — E559 Vitamin D deficiency, unspecified: Secondary | ICD-10-CM | POA: Diagnosis not present

## 2020-03-23 DIAGNOSIS — R011 Cardiac murmur, unspecified: Secondary | ICD-10-CM | POA: Diagnosis not present

## 2020-03-23 DIAGNOSIS — E782 Mixed hyperlipidemia: Secondary | ICD-10-CM | POA: Diagnosis not present

## 2020-03-23 DIAGNOSIS — D518 Other vitamin B12 deficiency anemias: Secondary | ICD-10-CM | POA: Diagnosis not present

## 2020-03-23 DIAGNOSIS — Z0000 Encounter for general adult medical examination without abnormal findings: Secondary | ICD-10-CM | POA: Diagnosis not present

## 2020-03-23 DIAGNOSIS — E1165 Type 2 diabetes mellitus with hyperglycemia: Secondary | ICD-10-CM | POA: Diagnosis not present

## 2020-03-23 DIAGNOSIS — E038 Other specified hypothyroidism: Secondary | ICD-10-CM | POA: Diagnosis not present

## 2020-03-23 DIAGNOSIS — Z79899 Other long term (current) drug therapy: Secondary | ICD-10-CM | POA: Diagnosis not present

## 2020-03-23 DIAGNOSIS — J4520 Mild intermittent asthma, uncomplicated: Secondary | ICD-10-CM | POA: Diagnosis not present

## 2020-03-23 DIAGNOSIS — Z1211 Encounter for screening for malignant neoplasm of colon: Secondary | ICD-10-CM | POA: Diagnosis not present

## 2020-03-23 DIAGNOSIS — I10 Essential (primary) hypertension: Secondary | ICD-10-CM | POA: Diagnosis not present

## 2020-03-25 DIAGNOSIS — R0989 Other specified symptoms and signs involving the circulatory and respiratory systems: Secondary | ICD-10-CM | POA: Diagnosis not present

## 2020-03-25 DIAGNOSIS — R590 Localized enlarged lymph nodes: Secondary | ICD-10-CM | POA: Diagnosis not present

## 2020-03-25 DIAGNOSIS — I6523 Occlusion and stenosis of bilateral carotid arteries: Secondary | ICD-10-CM | POA: Diagnosis not present

## 2020-03-28 DIAGNOSIS — I700 Atherosclerosis of aorta: Secondary | ICD-10-CM | POA: Diagnosis not present

## 2020-03-28 DIAGNOSIS — R221 Localized swelling, mass and lump, neck: Secondary | ICD-10-CM | POA: Diagnosis not present

## 2020-03-28 DIAGNOSIS — I714 Abdominal aortic aneurysm, without rupture: Secondary | ICD-10-CM | POA: Diagnosis not present

## 2020-03-28 DIAGNOSIS — D492 Neoplasm of unspecified behavior of bone, soft tissue, and skin: Secondary | ICD-10-CM | POA: Diagnosis not present

## 2020-03-28 DIAGNOSIS — E042 Nontoxic multinodular goiter: Secondary | ICD-10-CM | POA: Diagnosis not present

## 2020-03-28 DIAGNOSIS — R2240 Localized swelling, mass and lump, unspecified lower limb: Secondary | ICD-10-CM | POA: Diagnosis not present

## 2020-04-01 DIAGNOSIS — D518 Other vitamin B12 deficiency anemias: Secondary | ICD-10-CM | POA: Diagnosis not present

## 2020-05-09 DIAGNOSIS — D518 Other vitamin B12 deficiency anemias: Secondary | ICD-10-CM | POA: Diagnosis not present

## 2020-05-09 DIAGNOSIS — E782 Mixed hyperlipidemia: Secondary | ICD-10-CM | POA: Diagnosis not present

## 2020-05-09 DIAGNOSIS — E1165 Type 2 diabetes mellitus with hyperglycemia: Secondary | ICD-10-CM | POA: Diagnosis not present

## 2020-05-09 DIAGNOSIS — J4520 Mild intermittent asthma, uncomplicated: Secondary | ICD-10-CM | POA: Diagnosis not present

## 2020-05-09 DIAGNOSIS — E038 Other specified hypothyroidism: Secondary | ICD-10-CM | POA: Diagnosis not present

## 2020-05-09 DIAGNOSIS — I10 Essential (primary) hypertension: Secondary | ICD-10-CM | POA: Diagnosis not present

## 2020-05-09 DIAGNOSIS — E559 Vitamin D deficiency, unspecified: Secondary | ICD-10-CM | POA: Diagnosis not present

## 2020-05-11 DIAGNOSIS — D518 Other vitamin B12 deficiency anemias: Secondary | ICD-10-CM | POA: Diagnosis not present

## 2020-05-27 DIAGNOSIS — H40023 Open angle with borderline findings, high risk, bilateral: Secondary | ICD-10-CM | POA: Diagnosis not present

## 2020-05-27 DIAGNOSIS — H04123 Dry eye syndrome of bilateral lacrimal glands: Secondary | ICD-10-CM | POA: Diagnosis not present

## 2020-05-27 DIAGNOSIS — H53032 Strabismic amblyopia, left eye: Secondary | ICD-10-CM | POA: Diagnosis not present

## 2020-06-13 DIAGNOSIS — E782 Mixed hyperlipidemia: Secondary | ICD-10-CM | POA: Diagnosis not present

## 2020-06-13 DIAGNOSIS — J4520 Mild intermittent asthma, uncomplicated: Secondary | ICD-10-CM | POA: Diagnosis not present

## 2020-06-13 DIAGNOSIS — D518 Other vitamin B12 deficiency anemias: Secondary | ICD-10-CM | POA: Diagnosis not present

## 2020-06-13 DIAGNOSIS — E1165 Type 2 diabetes mellitus with hyperglycemia: Secondary | ICD-10-CM | POA: Diagnosis not present

## 2020-06-13 DIAGNOSIS — E559 Vitamin D deficiency, unspecified: Secondary | ICD-10-CM | POA: Diagnosis not present

## 2020-06-13 DIAGNOSIS — I10 Essential (primary) hypertension: Secondary | ICD-10-CM | POA: Diagnosis not present

## 2020-06-13 DIAGNOSIS — E038 Other specified hypothyroidism: Secondary | ICD-10-CM | POA: Diagnosis not present

## 2020-07-06 DIAGNOSIS — E782 Mixed hyperlipidemia: Secondary | ICD-10-CM | POA: Diagnosis not present

## 2020-07-06 DIAGNOSIS — J4520 Mild intermittent asthma, uncomplicated: Secondary | ICD-10-CM | POA: Diagnosis not present

## 2020-07-06 DIAGNOSIS — E1165 Type 2 diabetes mellitus with hyperglycemia: Secondary | ICD-10-CM | POA: Diagnosis not present

## 2020-07-06 DIAGNOSIS — D518 Other vitamin B12 deficiency anemias: Secondary | ICD-10-CM | POA: Diagnosis not present

## 2020-07-06 DIAGNOSIS — I10 Essential (primary) hypertension: Secondary | ICD-10-CM | POA: Diagnosis not present

## 2020-07-06 DIAGNOSIS — Z79899 Other long term (current) drug therapy: Secondary | ICD-10-CM | POA: Diagnosis not present

## 2020-07-06 DIAGNOSIS — K581 Irritable bowel syndrome with constipation: Secondary | ICD-10-CM | POA: Diagnosis not present

## 2020-07-06 DIAGNOSIS — E559 Vitamin D deficiency, unspecified: Secondary | ICD-10-CM | POA: Diagnosis not present

## 2020-07-06 DIAGNOSIS — E038 Other specified hypothyroidism: Secondary | ICD-10-CM | POA: Diagnosis not present

## 2020-07-06 DIAGNOSIS — K219 Gastro-esophageal reflux disease without esophagitis: Secondary | ICD-10-CM | POA: Diagnosis not present

## 2020-07-12 DIAGNOSIS — R0602 Shortness of breath: Secondary | ICD-10-CM | POA: Diagnosis not present

## 2020-07-12 DIAGNOSIS — Z23 Encounter for immunization: Secondary | ICD-10-CM | POA: Diagnosis not present

## 2020-07-19 DIAGNOSIS — E038 Other specified hypothyroidism: Secondary | ICD-10-CM | POA: Diagnosis not present

## 2020-07-19 DIAGNOSIS — E782 Mixed hyperlipidemia: Secondary | ICD-10-CM | POA: Diagnosis not present

## 2020-07-19 DIAGNOSIS — I10 Essential (primary) hypertension: Secondary | ICD-10-CM | POA: Diagnosis not present

## 2020-07-19 DIAGNOSIS — J4520 Mild intermittent asthma, uncomplicated: Secondary | ICD-10-CM | POA: Diagnosis not present

## 2020-07-19 DIAGNOSIS — E559 Vitamin D deficiency, unspecified: Secondary | ICD-10-CM | POA: Diagnosis not present

## 2020-07-19 DIAGNOSIS — E1165 Type 2 diabetes mellitus with hyperglycemia: Secondary | ICD-10-CM | POA: Diagnosis not present

## 2020-07-19 DIAGNOSIS — D518 Other vitamin B12 deficiency anemias: Secondary | ICD-10-CM | POA: Diagnosis not present

## 2020-08-11 DIAGNOSIS — J101 Influenza due to other identified influenza virus with other respiratory manifestations: Secondary | ICD-10-CM | POA: Diagnosis not present

## 2020-08-11 DIAGNOSIS — Z20828 Contact with and (suspected) exposure to other viral communicable diseases: Secondary | ICD-10-CM | POA: Diagnosis not present

## 2020-08-11 DIAGNOSIS — U071 COVID-19: Secondary | ICD-10-CM | POA: Diagnosis not present

## 2020-08-11 DIAGNOSIS — B974 Respiratory syncytial virus as the cause of diseases classified elsewhere: Secondary | ICD-10-CM | POA: Diagnosis not present

## 2020-08-15 DIAGNOSIS — D518 Other vitamin B12 deficiency anemias: Secondary | ICD-10-CM | POA: Diagnosis not present

## 2020-08-15 DIAGNOSIS — E782 Mixed hyperlipidemia: Secondary | ICD-10-CM | POA: Diagnosis not present

## 2020-08-15 DIAGNOSIS — E1165 Type 2 diabetes mellitus with hyperglycemia: Secondary | ICD-10-CM | POA: Diagnosis not present

## 2020-08-15 DIAGNOSIS — J9801 Acute bronchospasm: Secondary | ICD-10-CM | POA: Diagnosis not present

## 2020-08-15 DIAGNOSIS — I10 Essential (primary) hypertension: Secondary | ICD-10-CM | POA: Diagnosis not present

## 2020-08-31 DIAGNOSIS — E038 Other specified hypothyroidism: Secondary | ICD-10-CM | POA: Diagnosis not present

## 2020-08-31 DIAGNOSIS — K581 Irritable bowel syndrome with constipation: Secondary | ICD-10-CM | POA: Diagnosis not present

## 2020-08-31 DIAGNOSIS — E782 Mixed hyperlipidemia: Secondary | ICD-10-CM | POA: Diagnosis not present

## 2020-08-31 DIAGNOSIS — E559 Vitamin D deficiency, unspecified: Secondary | ICD-10-CM | POA: Diagnosis not present

## 2020-08-31 DIAGNOSIS — I10 Essential (primary) hypertension: Secondary | ICD-10-CM | POA: Diagnosis not present

## 2020-08-31 DIAGNOSIS — D518 Other vitamin B12 deficiency anemias: Secondary | ICD-10-CM | POA: Diagnosis not present

## 2020-08-31 DIAGNOSIS — J4520 Mild intermittent asthma, uncomplicated: Secondary | ICD-10-CM | POA: Diagnosis not present

## 2020-08-31 DIAGNOSIS — Z79899 Other long term (current) drug therapy: Secondary | ICD-10-CM | POA: Diagnosis not present

## 2020-08-31 DIAGNOSIS — E1165 Type 2 diabetes mellitus with hyperglycemia: Secondary | ICD-10-CM | POA: Diagnosis not present

## 2020-08-31 DIAGNOSIS — K219 Gastro-esophageal reflux disease without esophagitis: Secondary | ICD-10-CM | POA: Diagnosis not present

## 2020-09-14 DIAGNOSIS — E038 Other specified hypothyroidism: Secondary | ICD-10-CM | POA: Diagnosis not present

## 2020-09-14 DIAGNOSIS — I10 Essential (primary) hypertension: Secondary | ICD-10-CM | POA: Diagnosis not present

## 2020-09-14 DIAGNOSIS — E1165 Type 2 diabetes mellitus with hyperglycemia: Secondary | ICD-10-CM | POA: Diagnosis not present

## 2020-09-14 DIAGNOSIS — E782 Mixed hyperlipidemia: Secondary | ICD-10-CM | POA: Diagnosis not present

## 2020-09-14 DIAGNOSIS — E559 Vitamin D deficiency, unspecified: Secondary | ICD-10-CM | POA: Diagnosis not present

## 2020-09-14 DIAGNOSIS — D518 Other vitamin B12 deficiency anemias: Secondary | ICD-10-CM | POA: Diagnosis not present

## 2020-09-14 DIAGNOSIS — J4520 Mild intermittent asthma, uncomplicated: Secondary | ICD-10-CM | POA: Diagnosis not present

## 2020-09-19 DIAGNOSIS — D518 Other vitamin B12 deficiency anemias: Secondary | ICD-10-CM | POA: Diagnosis not present

## 2020-10-18 DIAGNOSIS — D518 Other vitamin B12 deficiency anemias: Secondary | ICD-10-CM | POA: Diagnosis not present

## 2020-10-18 DIAGNOSIS — E038 Other specified hypothyroidism: Secondary | ICD-10-CM | POA: Diagnosis not present

## 2020-10-18 DIAGNOSIS — E782 Mixed hyperlipidemia: Secondary | ICD-10-CM | POA: Diagnosis not present

## 2020-10-18 DIAGNOSIS — E559 Vitamin D deficiency, unspecified: Secondary | ICD-10-CM | POA: Diagnosis not present

## 2020-10-18 DIAGNOSIS — E1165 Type 2 diabetes mellitus with hyperglycemia: Secondary | ICD-10-CM | POA: Diagnosis not present

## 2020-10-18 DIAGNOSIS — J4520 Mild intermittent asthma, uncomplicated: Secondary | ICD-10-CM | POA: Diagnosis not present

## 2020-10-18 DIAGNOSIS — I10 Essential (primary) hypertension: Secondary | ICD-10-CM | POA: Diagnosis not present

## 2020-11-09 DIAGNOSIS — H40023 Open angle with borderline findings, high risk, bilateral: Secondary | ICD-10-CM | POA: Diagnosis not present

## 2020-11-09 DIAGNOSIS — H53032 Strabismic amblyopia, left eye: Secondary | ICD-10-CM | POA: Diagnosis not present

## 2020-11-09 DIAGNOSIS — E119 Type 2 diabetes mellitus without complications: Secondary | ICD-10-CM | POA: Diagnosis not present

## 2020-11-09 DIAGNOSIS — H04123 Dry eye syndrome of bilateral lacrimal glands: Secondary | ICD-10-CM | POA: Diagnosis not present

## 2020-11-09 DIAGNOSIS — H35371 Puckering of macula, right eye: Secondary | ICD-10-CM | POA: Diagnosis not present

## 2020-11-18 DIAGNOSIS — E038 Other specified hypothyroidism: Secondary | ICD-10-CM | POA: Diagnosis not present

## 2020-11-18 DIAGNOSIS — E782 Mixed hyperlipidemia: Secondary | ICD-10-CM | POA: Diagnosis not present

## 2020-11-18 DIAGNOSIS — J4520 Mild intermittent asthma, uncomplicated: Secondary | ICD-10-CM | POA: Diagnosis not present

## 2020-11-18 DIAGNOSIS — I10 Essential (primary) hypertension: Secondary | ICD-10-CM | POA: Diagnosis not present

## 2020-11-18 DIAGNOSIS — E1165 Type 2 diabetes mellitus with hyperglycemia: Secondary | ICD-10-CM | POA: Diagnosis not present

## 2020-11-18 DIAGNOSIS — D518 Other vitamin B12 deficiency anemias: Secondary | ICD-10-CM | POA: Diagnosis not present

## 2020-11-18 DIAGNOSIS — E559 Vitamin D deficiency, unspecified: Secondary | ICD-10-CM | POA: Diagnosis not present

## 2020-11-23 DIAGNOSIS — E559 Vitamin D deficiency, unspecified: Secondary | ICD-10-CM | POA: Diagnosis not present

## 2020-11-23 DIAGNOSIS — J4520 Mild intermittent asthma, uncomplicated: Secondary | ICD-10-CM | POA: Diagnosis not present

## 2020-11-23 DIAGNOSIS — Z0000 Encounter for general adult medical examination without abnormal findings: Secondary | ICD-10-CM | POA: Diagnosis not present

## 2020-11-23 DIAGNOSIS — Z79899 Other long term (current) drug therapy: Secondary | ICD-10-CM | POA: Diagnosis not present

## 2020-11-23 DIAGNOSIS — K219 Gastro-esophageal reflux disease without esophagitis: Secondary | ICD-10-CM | POA: Diagnosis not present

## 2020-11-23 DIAGNOSIS — D518 Other vitamin B12 deficiency anemias: Secondary | ICD-10-CM | POA: Diagnosis not present

## 2020-11-23 DIAGNOSIS — E782 Mixed hyperlipidemia: Secondary | ICD-10-CM | POA: Diagnosis not present

## 2020-11-23 DIAGNOSIS — I10 Essential (primary) hypertension: Secondary | ICD-10-CM | POA: Diagnosis not present

## 2020-11-23 DIAGNOSIS — E038 Other specified hypothyroidism: Secondary | ICD-10-CM | POA: Diagnosis not present

## 2020-11-23 DIAGNOSIS — E1165 Type 2 diabetes mellitus with hyperglycemia: Secondary | ICD-10-CM | POA: Diagnosis not present

## 2020-11-23 DIAGNOSIS — K581 Irritable bowel syndrome with constipation: Secondary | ICD-10-CM | POA: Diagnosis not present

## 2020-12-15 DIAGNOSIS — E038 Other specified hypothyroidism: Secondary | ICD-10-CM | POA: Diagnosis not present

## 2020-12-15 DIAGNOSIS — D518 Other vitamin B12 deficiency anemias: Secondary | ICD-10-CM | POA: Diagnosis not present

## 2020-12-15 DIAGNOSIS — I10 Essential (primary) hypertension: Secondary | ICD-10-CM | POA: Diagnosis not present

## 2020-12-15 DIAGNOSIS — J4520 Mild intermittent asthma, uncomplicated: Secondary | ICD-10-CM | POA: Diagnosis not present

## 2020-12-15 DIAGNOSIS — E559 Vitamin D deficiency, unspecified: Secondary | ICD-10-CM | POA: Diagnosis not present

## 2020-12-15 DIAGNOSIS — E782 Mixed hyperlipidemia: Secondary | ICD-10-CM | POA: Diagnosis not present

## 2020-12-15 DIAGNOSIS — E1165 Type 2 diabetes mellitus with hyperglycemia: Secondary | ICD-10-CM | POA: Diagnosis not present

## 2021-01-30 DIAGNOSIS — E559 Vitamin D deficiency, unspecified: Secondary | ICD-10-CM | POA: Diagnosis not present

## 2021-01-30 DIAGNOSIS — E038 Other specified hypothyroidism: Secondary | ICD-10-CM | POA: Diagnosis not present

## 2021-01-30 DIAGNOSIS — I10 Essential (primary) hypertension: Secondary | ICD-10-CM | POA: Diagnosis not present

## 2021-01-30 DIAGNOSIS — E782 Mixed hyperlipidemia: Secondary | ICD-10-CM | POA: Diagnosis not present

## 2021-01-30 DIAGNOSIS — J4520 Mild intermittent asthma, uncomplicated: Secondary | ICD-10-CM | POA: Diagnosis not present

## 2021-01-30 DIAGNOSIS — E1165 Type 2 diabetes mellitus with hyperglycemia: Secondary | ICD-10-CM | POA: Diagnosis not present

## 2021-01-30 DIAGNOSIS — D518 Other vitamin B12 deficiency anemias: Secondary | ICD-10-CM | POA: Diagnosis not present

## 2021-02-20 DIAGNOSIS — J4520 Mild intermittent asthma, uncomplicated: Secondary | ICD-10-CM | POA: Diagnosis not present

## 2021-02-20 DIAGNOSIS — D518 Other vitamin B12 deficiency anemias: Secondary | ICD-10-CM | POA: Diagnosis not present

## 2021-02-20 DIAGNOSIS — E1165 Type 2 diabetes mellitus with hyperglycemia: Secondary | ICD-10-CM | POA: Diagnosis not present

## 2021-02-20 DIAGNOSIS — E782 Mixed hyperlipidemia: Secondary | ICD-10-CM | POA: Diagnosis not present

## 2021-02-20 DIAGNOSIS — E038 Other specified hypothyroidism: Secondary | ICD-10-CM | POA: Diagnosis not present

## 2021-02-20 DIAGNOSIS — I10 Essential (primary) hypertension: Secondary | ICD-10-CM | POA: Diagnosis not present

## 2021-02-20 DIAGNOSIS — E559 Vitamin D deficiency, unspecified: Secondary | ICD-10-CM | POA: Diagnosis not present

## 2021-02-22 DIAGNOSIS — E782 Mixed hyperlipidemia: Secondary | ICD-10-CM | POA: Diagnosis not present

## 2021-02-22 DIAGNOSIS — J4520 Mild intermittent asthma, uncomplicated: Secondary | ICD-10-CM | POA: Diagnosis not present

## 2021-02-22 DIAGNOSIS — Z79899 Other long term (current) drug therapy: Secondary | ICD-10-CM | POA: Diagnosis not present

## 2021-02-22 DIAGNOSIS — E559 Vitamin D deficiency, unspecified: Secondary | ICD-10-CM | POA: Diagnosis not present

## 2021-02-22 DIAGNOSIS — Z1211 Encounter for screening for malignant neoplasm of colon: Secondary | ICD-10-CM | POA: Diagnosis not present

## 2021-02-22 DIAGNOSIS — D518 Other vitamin B12 deficiency anemias: Secondary | ICD-10-CM | POA: Diagnosis not present

## 2021-02-22 DIAGNOSIS — I10 Essential (primary) hypertension: Secondary | ICD-10-CM | POA: Diagnosis not present

## 2021-02-22 DIAGNOSIS — E1165 Type 2 diabetes mellitus with hyperglycemia: Secondary | ICD-10-CM | POA: Diagnosis not present

## 2021-02-22 DIAGNOSIS — E038 Other specified hypothyroidism: Secondary | ICD-10-CM | POA: Diagnosis not present

## 2021-02-22 DIAGNOSIS — D519 Vitamin B12 deficiency anemia, unspecified: Secondary | ICD-10-CM | POA: Diagnosis not present

## 2021-02-23 DIAGNOSIS — E1165 Type 2 diabetes mellitus with hyperglycemia: Secondary | ICD-10-CM | POA: Diagnosis not present

## 2021-02-23 DIAGNOSIS — E559 Vitamin D deficiency, unspecified: Secondary | ICD-10-CM | POA: Diagnosis not present

## 2021-02-23 DIAGNOSIS — E782 Mixed hyperlipidemia: Secondary | ICD-10-CM | POA: Diagnosis not present

## 2022-03-27 ENCOUNTER — Encounter: Payer: Self-pay | Admitting: *Deleted

## 2022-04-11 ENCOUNTER — Encounter: Payer: Self-pay | Admitting: Physician Assistant

## 2022-05-07 ENCOUNTER — Ambulatory Visit (INDEPENDENT_AMBULATORY_CARE_PROVIDER_SITE_OTHER): Payer: Medicare Other | Admitting: Physician Assistant

## 2022-05-07 ENCOUNTER — Encounter: Payer: Self-pay | Admitting: Physician Assistant

## 2022-05-07 VITALS — BP 162/82 | HR 70 | Ht 60.0 in | Wt 161.0 lb

## 2022-05-07 DIAGNOSIS — Z86010 Personal history of colonic polyps: Secondary | ICD-10-CM

## 2022-05-07 DIAGNOSIS — K6289 Other specified diseases of anus and rectum: Secondary | ICD-10-CM | POA: Diagnosis not present

## 2022-05-07 DIAGNOSIS — K645 Perianal venous thrombosis: Secondary | ICD-10-CM | POA: Diagnosis not present

## 2022-05-07 MED ORDER — HYDROCORTISONE (PERIANAL) 2.5 % EX CREA: 1.0000 | TOPICAL_CREAM | Freq: Two times a day (BID) | CUTANEOUS | 1 refills | Status: AC

## 2022-05-07 NOTE — Progress Notes (Signed)
Chief Complaint: Painful Hemorrhoids  HPI:    Jacqueline Hart is a 76 year old female, known to Dr. Silverio Decamp, with a past medical history of diabetes and others listed below, who was referred to me by Jacqueline Hart, * for a complaint of painful hemorrhoids.      04/08/2017 colonoscopy with one 5 mm polyp in the ascending colon, one 2 mm polyp in the rectum and nonbleeding internal hemorrhoids.  Pathology showed adenomatous polyps and repeat was recommended in 5 years.    Today, patient presents to clinic accompanied by her daughter-in-law and tells me that for 3 weeks she has constant pain and burning in her rectum that allows her not even sit in a chair.  She has tried witch hazel which really does not help.  She knows this is brought on by her constipation and she has started taking her Linzess 72 mcg every other day instead of once a week which is helped her bowel movements but she continues with rectal pain.  She has not seen any bleeding.  She has not taken any over-the-counter pain meds to help.  Tells me she just cannot live like this much longer.    Denies fever, chills or weight loss.  Past Medical History:  Diagnosis Date   Diabetes mellitus    Hyperlipidemia    Hypertension     Past Surgical History:  Procedure Laterality Date   CATARACT EXTRACTION Bilateral    MINOR HEMORRHOIDECTOMY  2009    Current Outpatient Medications  Medication Sig Dispense Refill   atorvastatin (LIPITOR) 40 MG tablet Take 1 tablet (40 mg total) by mouth daily. 90 tablet 3   celecoxib (CELEBREX) 50 MG capsule Take 1 capsule (50 mg total) by mouth 2 (two) times daily. 30 capsule 1   cholecalciferol (VITAMIN D) 400 units TABS tablet Take 400 Units by mouth.     hydrochlorothiazide (MICROZIDE) 12.5 MG capsule Take 1 capsule (12.5 mg total) by mouth daily. 90 capsule 1   Linaclotide (LINZESS PO) Take by mouth.     losartan (COZAAR) 25 MG tablet Take 1 tablet (25 mg total) by mouth daily. 90 tablet 1    metFORMIN (GLUCOPHAGE-XR) 500 MG 24 hr tablet 2 (two) times daily.     metoprolol succinate (TOPROL-XL) 50 MG 24 hr tablet TAKE 1 TABLET BY MOUTH ONCE DAILY. TAKE WITH OR IMMEDIATELY FOLLOWING A MEAL 90 tablet 1   omeprazole (PRILOSEC) 40 MG capsule Take 1 capsule (40 mg total) by mouth daily as needed. 90 capsule 0   OVER THE COUNTER MEDICATION STOOL SOFTNER     Current Facility-Administered Medications  Medication Dose Route Frequency Provider Last Rate Last Admin   0.9 %  sodium chloride infusion  500 mL Intravenous Continuous Nandigam, Venia Minks, MD        Allergies as of 05/07/2022   (No Known Allergies)    Family History  Problem Relation Age of Onset   Diabetes Father     Social History   Socioeconomic History   Marital status: Married    Spouse name: Not on file   Number of children: 3   Years of education: Not on file   Highest education level: Not on file  Occupational History   Not on file  Tobacco Use   Smoking status: Never   Smokeless tobacco: Never  Vaping Use   Vaping Use: Never used  Substance and Sexual Activity   Alcohol use: No   Drug use: Not on file  Sexual activity: Not on file  Other Topics Concern   Not on file  Social History Narrative   Lives with husband    Works part time [employer]   01/24/17   Social Determinants of Radio broadcast assistant Strain: Not on file  Food Insecurity: Not on file  Transportation Needs: Not on file  Physical Activity: Not on file  Stress: Not on file  Social Connections: Not on file  Intimate Partner Violence: Not on file    Review of Systems:    Constitutional: No weight loss, fever or chills Skin: No rash  Cardiovascular: No chest pain  Respiratory: No SOB  Gastrointestinal: See HPI and otherwise negative Genitourinary: No dysuria Neurological: No headache, dizziness or syncope Musculoskeletal: No new muscle or joint pain Hematologic: No bleeding  Psychiatric: No history of depression  or anxiety   Physical Exam:  Vital signs: BP (!) 162/82   Pulse 70   Ht 5' (1.524 m)   Wt 161 lb (73 kg)   BMI 31.44 kg/m    Constitutional:   Pleasant female appears to be in NAD, Well developed, Well nourished, alert and cooperative Head:  Normocephalic and atraumatic. Eyes:   PEERL, EOMI. No icterus. Conjunctiva pink. Ears:  Normal auditory acuity. Neck:  Supple Throat: Oral cavity and pharynx without inflammation, swelling or lesion.  Respiratory: Respirations even and unlabored. Lungs clear to auscultation bilaterally.   No wheezes, crackles, or rhonchi.  Cardiovascular: Normal S1, S2. No MRG. Regular rate and rhythm. No peripheral edema, cyanosis or pallor.  Gastrointestinal:  Soft, nondistended, nontender. No rebound or guarding. Normal bowel sounds. No appreciable masses or hepatomegaly. Rectal: External: Very small hard external hemorrhoids (likely thrombosed), internal: Unable to complete due to tenderness Msk:  Symmetrical without gross deformities. Without edema, no deformity or joint abnormality.  Neurologic:  Alert and  oriented x4;  grossly normal neurologically.  Skin:   Dry and intact without significant lesions or rashes. Psychiatric: Demonstrates good judgement and reason without abnormal affect or behaviors.  No recent labs or imaging.  Assessment: 1.  Painful external hemorrhoids: Very small but very tender on exam today, unable to do an internal exam or an anoscopy, could also be underlying etiology such as fissure 2.  History of adenomatous polyps: Last colonoscopy 5 years ago with repeat recommended now 3.  Constipation: Chronic for the patient, better when she actually uses her Linzess  Plan: 1.  We will send urgent referral to [facility] for further evaluation of rectal pain.  Most likely this is due to the very small thrombosed hemorrhoid seen at time of exam today.  If not they may recommend further exam under anesthesia for a better look at what could be the  source. 2.  Started the patient on Hydrocortisone ointment 2.5% to twice daily x7 to 14 days. 3.  Recommend sits baths for 15 to 20 minutes 3 times daily.  Would discontinue ice. 4.  Start RectiCare with lidocaine over-the-counter as needed for pain 5.  Recommend OTC pain relievers such as Tylenol or ibuprofen every 4-6 hours.  If these are not helpful can prescribe Tramadol.  She will call and let me know. 6.  Patient also scheduled for a surveillance colonoscopy given history of adenomatous polyps.  This will be delayed for a couple of months to allow her time to get better from the above. 7.  Recommend the patient use her Linzess 72 mcg every other day indefinitely to prevent this from in the  future. 8.  Patient to follow in clinic with [place] as needed in the interim.  Ellouise Newer, PA-C [facility] 05/07/2022, 2:22 PM  Cc: Jacqueline Hart, *

## 2022-05-07 NOTE — Patient Instructions (Addendum)
How to Take a [employer] A sitz bath is a warm water bath that may be used to care for your rectum, genital area, or the area between your rectum and genitals (perineum). In a sitz bath, the water only comes up to your hips and covers your buttocks. A sitz bath may be done in a bathtub or with a portable sitz bath that fits over the toilet. Your health care provider may recommend a sitz bath to help: Relieve pain and discomfort after delivering a baby. Relieve pain and itching from hemorrhoids or anal fissures. Relieve pain after certain surgeries. Relax muscles that are sore or tight. How to take a sitz bath Take 3-4 sitz baths a day, or as many as told by your health care provider. Bathtub sitz bath To take a sitz bath in a bathtub: Partially fill a bathtub with warm water. The water should be deep enough to cover your hips and buttocks when you are sitting in the tub. Follow your health care provider's instructions if you are told to put medicine in the water. Sit in the water. Open the tub drain a little, and leave it open during your bath. Turn on the warm water again, enough to replace the water that is draining out. Keep the water running throughout your bath. This helps keep the water at the right level and temperature. Soak in the water for 15-20 minutes, or as long as told by your health care provider. When you are done, be careful when you stand up. You may feel dizzy. After the sitz bath, pat yourself dry. Do not rub your skin to dry it.  Over-the-toilet sitz bath To take a sitz bath with an over-the-toilet basin: Follow the manufacturer's instructions. Fill the basin with warm water. Follow your health care provider's instructions if you were told to put medicine in the water. Sit on the seat. Make sure the water covers your buttocks and perineum. Soak in the water for 15-20 minutes, or as long as told by your health care provider. After the sitz bath, pat yourself dry. Do not  rub your skin to dry it. Clean and dry the basin between uses. Discard the basin if it cracks, or according to the manufacturer's instructions.  Contact a health care provider if: Your pain or itching gets worse. Do not continue with sitz baths if your symptoms get worse. You have new symptoms. Do not continue with sitz baths until you talk with your health care provider. Summary A sitz bath is a warm water bath in which the water only comes up to your hips and covers your buttocks. A sitz bath may help relieve pain and discomfort after delivering a baby. It also may help with pain and itching from hemorrhoids or anal fissures, or pain after certain surgeries. It can also help to relax muscles that are sore or tight. Take 3-4 sitz baths a day, or as many as told by your health care provider. Soak in the water for 15-20 minutes. Do not continue with sitz baths if your symptoms get worse. This information is not intended to replace advice given to you by your health care provider. Make sure you discuss any questions you have with your health care provider. Document Revised: 06/21/2020 Document Reviewed: 06/23/2020 [employer] Patient Education  [facility] have been scheduled for a colonoscopy. Please follow written instructions given to you at your visit today.  Please pick up your prep supplies at the pharmacy within  the next 1-3 days. If you use inhalers (even only as needed), please bring them with you on the day of your procedure. ..  Purchase OTC Recticare with Lidocaine   Recticare with Lidocaine   Try extra strength Tylenol  for pain   I appreciate the  opportunity to care for you  Thank You   Lovett Calender

## 2022-06-12 ENCOUNTER — Telehealth: Payer: Self-pay | Admitting: Gastroenterology

## 2022-06-12 NOTE — Telephone Encounter (Signed)
Saw Jacqueline Hart 05/07/22 and also saw a surgeon same day. Colonoscopy is 06/15/22. Would you call her please?

## 2022-06-12 NOTE — Telephone Encounter (Signed)
Returned call to patient. She states that she just needed to reschedule her colonoscopy appointment. She did not have any other concerns at this time.

## 2022-06-12 NOTE — Telephone Encounter (Signed)
Patient called, states hemorrhoid is really bad, and medications are not help. Would like to know if she needs to proceed with procedure on 8/25. Please call to advise.

## 2022-06-15 ENCOUNTER — Encounter: Payer: Medicare Other | Admitting: Gastroenterology

## 2022-07-11 ENCOUNTER — Encounter: Payer: Medicare Other | Admitting: Gastroenterology
